# Patient Record
Sex: Male | Born: 1980 | Race: Black or African American | Hispanic: No | Marital: Single | Smoking: Former smoker
Health system: Southern US, Community
[De-identification: ages and names within clinical notes are randomized; demographics above are authoritative.]

## PROBLEM LIST (undated history)

## (undated) DIAGNOSIS — N321 Vesicointestinal fistula: Secondary | ICD-10-CM

## (undated) DIAGNOSIS — Z794 Long term (current) use of insulin: Secondary | ICD-10-CM

## (undated) DIAGNOSIS — N390 Urinary tract infection, site not specified: Secondary | ICD-10-CM

## (undated) DIAGNOSIS — N319 Neuromuscular dysfunction of bladder, unspecified: Secondary | ICD-10-CM

## (undated) DIAGNOSIS — M4628 Osteomyelitis of vertebra, sacral and sacrococcygeal region: Secondary | ICD-10-CM

## (undated) DIAGNOSIS — E649 Sequelae of unspecified nutritional deficiency: Secondary | ICD-10-CM

## (undated) DIAGNOSIS — I10 Essential (primary) hypertension: Secondary | ICD-10-CM

## (undated) DIAGNOSIS — E669 Obesity, unspecified: Secondary | ICD-10-CM

## (undated) DIAGNOSIS — J189 Pneumonia, unspecified organism: Secondary | ICD-10-CM

## (undated) DIAGNOSIS — E875 Hyperkalemia: Secondary | ICD-10-CM

## (undated) DIAGNOSIS — N322 Vesical fistula, not elsewhere classified: Secondary | ICD-10-CM

## (undated) DIAGNOSIS — Z7901 Long term (current) use of anticoagulants: Secondary | ICD-10-CM

## (undated) DIAGNOSIS — K219 Gastro-esophageal reflux disease without esophagitis: Secondary | ICD-10-CM

## (undated) DIAGNOSIS — Z906 Acquired absence of other parts of urinary tract: Secondary | ICD-10-CM

## (undated) DIAGNOSIS — A419 Sepsis, unspecified organism: Secondary | ICD-10-CM

## (undated) DIAGNOSIS — G8929 Other chronic pain: Secondary | ICD-10-CM

## (undated) DIAGNOSIS — Z932 Ileostomy status: Secondary | ICD-10-CM

## (undated) DIAGNOSIS — E108 Type 1 diabetes mellitus with unspecified complications: Secondary | ICD-10-CM

## (undated) DIAGNOSIS — L89154 Pressure ulcer of sacral region, stage 4: Secondary | ICD-10-CM

## (undated) DIAGNOSIS — Z86718 Personal history of other venous thrombosis and embolism: Secondary | ICD-10-CM

## (undated) DIAGNOSIS — G8221 Paraplegia, complete: Secondary | ICD-10-CM

## (undated) HISTORY — PX: REVISION UROSTOMY CUTANEOUS: SUR1282

## (undated) HISTORY — PX: COLOSTOMY: SHX63

---

## 2017-04-25 ENCOUNTER — Encounter (HOSPITAL_COMMUNITY): Payer: Self-pay | Admitting: Emergency Medicine

## 2017-04-25 ENCOUNTER — Inpatient Hospital Stay (HOSPITAL_COMMUNITY)
Admission: EM | Admit: 2017-04-25 | Discharge: 2017-05-03 | DRG: 871 | Disposition: A | Discharge status: Skilled Nursing Facility | Payer: Medicare Other | Attending: Internal Medicine | Admitting: Internal Medicine

## 2017-04-25 DIAGNOSIS — E876 Hypokalemia: Secondary | ICD-10-CM

## 2017-04-25 DIAGNOSIS — Z888 Allergy status to other drugs, medicaments and biological substances status: Secondary | ICD-10-CM

## 2017-04-25 DIAGNOSIS — G8220 Paraplegia, unspecified: Secondary | ICD-10-CM

## 2017-04-25 DIAGNOSIS — N179 Acute kidney failure, unspecified: Secondary | ICD-10-CM | POA: Diagnosis present

## 2017-04-25 DIAGNOSIS — N319 Neuromuscular dysfunction of bladder, unspecified: Secondary | ICD-10-CM | POA: Diagnosis present

## 2017-04-25 DIAGNOSIS — L89150 Pressure ulcer of sacral region, unstageable: Secondary | ICD-10-CM

## 2017-04-25 DIAGNOSIS — R443 Hallucinations, unspecified: Secondary | ICD-10-CM | POA: Diagnosis present

## 2017-04-25 DIAGNOSIS — Z79899 Other long term (current) drug therapy: Secondary | ICD-10-CM

## 2017-04-25 DIAGNOSIS — R9431 Abnormal electrocardiogram [ECG] [EKG]: Secondary | ICD-10-CM | POA: Diagnosis present

## 2017-04-25 DIAGNOSIS — W3400XS Accidental discharge from unspecified firearms or gun, sequela: Secondary | ICD-10-CM

## 2017-04-25 DIAGNOSIS — Z436 Encounter for attention to other artificial openings of urinary tract: Secondary | ICD-10-CM

## 2017-04-25 DIAGNOSIS — L89154 Pressure ulcer of sacral region, stage 4: Secondary | ICD-10-CM

## 2017-04-25 DIAGNOSIS — Z794 Long term (current) use of insulin: Secondary | ICD-10-CM

## 2017-04-25 DIAGNOSIS — B965 Pseudomonas (aeruginosa) (mallei) (pseudomallei) as the cause of diseases classified elsewhere: Secondary | ICD-10-CM | POA: Diagnosis present

## 2017-04-25 DIAGNOSIS — E1069 Type 1 diabetes mellitus with other specified complication: Secondary | ICD-10-CM | POA: Diagnosis present

## 2017-04-25 DIAGNOSIS — M4628 Osteomyelitis of vertebra, sacral and sacrococcygeal region: Secondary | ICD-10-CM | POA: Diagnosis present

## 2017-04-25 DIAGNOSIS — R7881 Bacteremia: Secondary | ICD-10-CM

## 2017-04-25 DIAGNOSIS — A419 Sepsis, unspecified organism: Secondary | ICD-10-CM | POA: Diagnosis not present

## 2017-04-25 DIAGNOSIS — R319 Hematuria, unspecified: Secondary | ICD-10-CM | POA: Diagnosis not present

## 2017-04-25 DIAGNOSIS — L98499 Non-pressure chronic ulcer of skin of other sites with unspecified severity: Secondary | ICD-10-CM

## 2017-04-25 DIAGNOSIS — Z936 Other artificial openings of urinary tract status: Secondary | ICD-10-CM

## 2017-04-25 DIAGNOSIS — L89893 Pressure ulcer of other site, stage 3: Secondary | ICD-10-CM | POA: Diagnosis present

## 2017-04-25 DIAGNOSIS — K219 Gastro-esophageal reflux disease without esophagitis: Secondary | ICD-10-CM | POA: Diagnosis present

## 2017-04-25 DIAGNOSIS — Z933 Colostomy status: Secondary | ICD-10-CM

## 2017-04-25 DIAGNOSIS — E10649 Type 1 diabetes mellitus with hypoglycemia without coma: Secondary | ICD-10-CM | POA: Diagnosis not present

## 2017-04-25 DIAGNOSIS — G8221 Paraplegia, complete: Secondary | ICD-10-CM | POA: Diagnosis present

## 2017-04-25 DIAGNOSIS — Z87891 Personal history of nicotine dependence: Secondary | ICD-10-CM

## 2017-04-25 DIAGNOSIS — N189 Chronic kidney disease, unspecified: Secondary | ICD-10-CM | POA: Diagnosis present

## 2017-04-25 DIAGNOSIS — E1022 Type 1 diabetes mellitus with diabetic chronic kidney disease: Secondary | ICD-10-CM | POA: Diagnosis present

## 2017-04-25 DIAGNOSIS — E1010 Type 1 diabetes mellitus with ketoacidosis without coma: Secondary | ICD-10-CM | POA: Diagnosis present

## 2017-04-25 DIAGNOSIS — Z932 Ileostomy status: Secondary | ICD-10-CM

## 2017-04-25 DIAGNOSIS — K920 Hematemesis: Secondary | ICD-10-CM | POA: Diagnosis present

## 2017-04-25 DIAGNOSIS — I129 Hypertensive chronic kidney disease with stage 1 through stage 4 chronic kidney disease, or unspecified chronic kidney disease: Secondary | ICD-10-CM | POA: Diagnosis present

## 2017-04-25 DIAGNOSIS — D649 Anemia, unspecified: Secondary | ICD-10-CM | POA: Diagnosis present

## 2017-04-25 DIAGNOSIS — Z7901 Long term (current) use of anticoagulants: Secondary | ICD-10-CM

## 2017-04-25 DIAGNOSIS — Z86718 Personal history of other venous thrombosis and embolism: Secondary | ICD-10-CM

## 2017-04-25 DIAGNOSIS — E1110 Type 2 diabetes mellitus with ketoacidosis without coma: Secondary | ICD-10-CM | POA: Diagnosis present

## 2017-04-25 HISTORY — DX: Personal history of other venous thrombosis and embolism: Z86.718

## 2017-04-25 HISTORY — DX: Essential (primary) hypertension: I10

## 2017-04-25 HISTORY — DX: Gastro-esophageal reflux disease without esophagitis: K21.9

## 2017-04-25 HISTORY — DX: Other chronic pain: G89.29

## 2017-04-25 HISTORY — DX: Long term (current) use of insulin: Z79.4

## 2017-04-25 HISTORY — DX: Sequelae of unspecified nutritional deficiency: E64.9

## 2017-04-25 HISTORY — DX: Long term (current) use of anticoagulants: Z79.01

## 2017-04-25 HISTORY — DX: Pressure ulcer of sacral region, stage 4: L89.154

## 2017-04-25 HISTORY — DX: Type 1 diabetes mellitus with unspecified complications: E10.8

## 2017-04-25 HISTORY — DX: Vesical fistula, not elsewhere classified: N32.2

## 2017-04-25 HISTORY — DX: Acquired absence of other parts of urinary tract: Z90.6

## 2017-04-25 HISTORY — DX: Vesicointestinal fistula: N32.1

## 2017-04-25 HISTORY — DX: Neuromuscular dysfunction of bladder, unspecified: N31.9

## 2017-04-25 HISTORY — DX: Obesity, unspecified: E66.9

## 2017-04-25 HISTORY — DX: Paraplegia, complete: G82.21

## 2017-04-25 HISTORY — DX: Ileostomy status: Z93.2

## 2017-04-25 HISTORY — DX: Urinary tract infection, site not specified: N39.0

## 2017-04-25 HISTORY — DX: Pneumonia, unspecified organism: J18.9

## 2017-04-25 HISTORY — DX: Sepsis, unspecified organism: A41.9

## 2017-04-25 HISTORY — DX: Osteomyelitis of vertebra, sacral and sacrococcygeal region: M46.28

## 2017-04-25 HISTORY — DX: Hyperkalemia: E87.5

## 2017-04-25 MED ORDER — ONDANSETRON HCL 4 MG/2ML IJ SOLN
4.0000 mg | Freq: Once | INTRAMUSCULAR | Status: AC
  Administered 2017-04-26: 4 mg via INTRAVENOUS
  Filled 2017-04-25: qty 2

## 2017-04-25 MED ORDER — SODIUM CHLORIDE 0.9 % IV BOLUS (SEPSIS)
1000.0000 mL | Freq: Once | INTRAVENOUS | Status: AC
  Administered 2017-04-26: 1000 mL via INTRAVENOUS
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
## 2017-04-26 ENCOUNTER — Emergency Department (HOSPITAL_COMMUNITY): Payer: Medicare Other

## 2017-04-26 ENCOUNTER — Other Ambulatory Visit: Payer: Self-pay

## 2017-04-26 ENCOUNTER — Encounter (HOSPITAL_COMMUNITY): Payer: Self-pay | Admitting: Internal Medicine

## 2017-04-26 DIAGNOSIS — E876 Hypokalemia: Secondary | ICD-10-CM

## 2017-04-26 DIAGNOSIS — R319 Hematuria, unspecified: Secondary | ICD-10-CM

## 2017-04-26 DIAGNOSIS — E1010 Type 1 diabetes mellitus with ketoacidosis without coma: Secondary | ICD-10-CM | POA: Diagnosis not present

## 2017-04-26 DIAGNOSIS — D649 Anemia, unspecified: Secondary | ICD-10-CM | POA: Diagnosis present

## 2017-04-26 DIAGNOSIS — Z888 Allergy status to other drugs, medicaments and biological substances status: Secondary | ICD-10-CM | POA: Diagnosis not present

## 2017-04-26 DIAGNOSIS — R9431 Abnormal electrocardiogram [ECG] [EKG]: Secondary | ICD-10-CM | POA: Diagnosis not present

## 2017-04-26 DIAGNOSIS — N189 Chronic kidney disease, unspecified: Secondary | ICD-10-CM | POA: Diagnosis present

## 2017-04-26 DIAGNOSIS — L89150 Pressure ulcer of sacral region, unstageable: Secondary | ICD-10-CM | POA: Diagnosis not present

## 2017-04-26 DIAGNOSIS — E1110 Type 2 diabetes mellitus with ketoacidosis without coma: Secondary | ICD-10-CM | POA: Diagnosis present

## 2017-04-26 DIAGNOSIS — Z436 Encounter for attention to other artificial openings of urinary tract: Secondary | ICD-10-CM | POA: Diagnosis not present

## 2017-04-26 DIAGNOSIS — L98499 Non-pressure chronic ulcer of skin of other sites with unspecified severity: Secondary | ICD-10-CM

## 2017-04-26 DIAGNOSIS — Z79899 Other long term (current) drug therapy: Secondary | ICD-10-CM | POA: Diagnosis not present

## 2017-04-26 DIAGNOSIS — L89893 Pressure ulcer of other site, stage 3: Secondary | ICD-10-CM | POA: Diagnosis not present

## 2017-04-26 DIAGNOSIS — G8221 Paraplegia, complete: Secondary | ICD-10-CM | POA: Diagnosis not present

## 2017-04-26 DIAGNOSIS — A419 Sepsis, unspecified organism: Secondary | ICD-10-CM | POA: Diagnosis not present

## 2017-04-26 DIAGNOSIS — L89154 Pressure ulcer of sacral region, stage 4: Secondary | ICD-10-CM | POA: Diagnosis not present

## 2017-04-26 DIAGNOSIS — Z794 Long term (current) use of insulin: Secondary | ICD-10-CM | POA: Diagnosis not present

## 2017-04-26 DIAGNOSIS — N179 Acute kidney failure, unspecified: Secondary | ICD-10-CM | POA: Diagnosis not present

## 2017-04-26 DIAGNOSIS — Z936 Other artificial openings of urinary tract status: Secondary | ICD-10-CM | POA: Diagnosis not present

## 2017-04-26 DIAGNOSIS — W3400XS Accidental discharge from unspecified firearms or gun, sequela: Secondary | ICD-10-CM | POA: Diagnosis not present

## 2017-04-26 DIAGNOSIS — E1022 Type 1 diabetes mellitus with diabetic chronic kidney disease: Secondary | ICD-10-CM | POA: Diagnosis present

## 2017-04-26 DIAGNOSIS — R443 Hallucinations, unspecified: Secondary | ICD-10-CM | POA: Diagnosis not present

## 2017-04-26 DIAGNOSIS — E10649 Type 1 diabetes mellitus with hypoglycemia without coma: Secondary | ICD-10-CM | POA: Diagnosis not present

## 2017-04-26 DIAGNOSIS — Z87891 Personal history of nicotine dependence: Secondary | ICD-10-CM | POA: Diagnosis not present

## 2017-04-26 DIAGNOSIS — Z7901 Long term (current) use of anticoagulants: Secondary | ICD-10-CM | POA: Diagnosis not present

## 2017-04-26 DIAGNOSIS — Z86718 Personal history of other venous thrombosis and embolism: Secondary | ICD-10-CM | POA: Diagnosis not present

## 2017-04-26 DIAGNOSIS — M4628 Osteomyelitis of vertebra, sacral and sacrococcygeal region: Secondary | ICD-10-CM | POA: Diagnosis not present

## 2017-04-26 DIAGNOSIS — Z933 Colostomy status: Secondary | ICD-10-CM | POA: Diagnosis not present

## 2017-04-26 DIAGNOSIS — K920 Hematemesis: Secondary | ICD-10-CM | POA: Diagnosis not present

## 2017-04-26 LAB — CBC WITH DIFFERENTIAL/PLATELET
BASOS ABS: 0 10*3/uL (ref 0.0–0.1)
Basophils Absolute: 0 10*3/uL (ref 0.0–0.1)
Basophils Relative: 0 %
Basophils Relative: 0 %
EOS PCT: 0 %
Eosinophils Absolute: 0.1 10*3/uL (ref 0.0–0.7)
Eosinophils Absolute: 0.1 10*3/uL (ref 0.0–0.7)
Eosinophils Relative: 0 %
HCT: 26.9 % — ABNORMAL LOW (ref 39.0–52.0)
HEMATOCRIT: 25.9 % — AB (ref 39.0–52.0)
HEMOGLOBIN: 8.4 g/dL — AB (ref 13.0–17.0)
Hemoglobin: 8 g/dL — ABNORMAL LOW (ref 13.0–17.0)
LYMPHS ABS: 1.7 10*3/uL (ref 0.7–4.0)
LYMPHS PCT: 9 %
Lymphocytes Relative: 2 %
Lymphs Abs: 0.3 10*3/uL — ABNORMAL LOW (ref 0.7–4.0)
MCH: 26.5 pg (ref 26.0–34.0)
MCH: 26.6 pg (ref 26.0–34.0)
MCHC: 31.2 g/dL (ref 30.0–36.0)
MCHC: 30.9 g/dL (ref 30.0–36.0)
MCV: 84.9 fL (ref 78.0–100.0)
MCV: 86 fL (ref 78.0–100.0)
MONO ABS: 0.9 10*3/uL (ref 0.1–1.0)
MONOS PCT: 5 %
Monocytes Absolute: 1.9 10*3/uL — ABNORMAL HIGH (ref 0.1–1.0)
Monocytes Relative: 10 %
NEUTROS ABS: 14.8 10*3/uL — AB (ref 1.7–7.7)
NEUTROS PCT: 81 %
NEUTROS PCT: 93 %
Neutro Abs: 18.8 10*3/uL — ABNORMAL HIGH (ref 1.7–7.7)
PLATELETS: 374 10*3/uL (ref 150–400)
Platelets: 398 10*3/uL (ref 150–400)
RBC: 3.17 MIL/uL — AB (ref 4.22–5.81)
RBC: 3.01 MIL/uL — ABNORMAL LOW (ref 4.22–5.81)
RDW: 15.9 % — ABNORMAL HIGH (ref 11.5–15.5)
RDW: 16.1 % — AB (ref 11.5–15.5)
WBC: 18.4 10*3/uL — AB (ref 4.0–10.5)
WBC: 20.1 10*3/uL — ABNORMAL HIGH (ref 4.0–10.5)

## 2017-04-26 LAB — BLOOD CULTURE ID PANEL (REFLEXED)
Acinetobacter baumannii: NOT DETECTED
CARBAPENEM RESISTANCE: NOT DETECTED
Candida albicans: NOT DETECTED
Candida glabrata: NOT DETECTED
Candida krusei: NOT DETECTED
Candida parapsilosis: NOT DETECTED
Candida tropicalis: NOT DETECTED
ENTEROBACTERIACEAE SPECIES: NOT DETECTED
Enterobacter cloacae complex: NOT DETECTED
Enterococcus species: NOT DETECTED
Escherichia coli: NOT DETECTED
HAEMOPHILUS INFLUENZAE: NOT DETECTED
Klebsiella oxytoca: NOT DETECTED
Klebsiella pneumoniae: NOT DETECTED
Listeria monocytogenes: NOT DETECTED
Neisseria meningitidis: NOT DETECTED
PSEUDOMONAS AERUGINOSA: DETECTED — AB
Proteus species: NOT DETECTED
STAPHYLOCOCCUS AUREUS BCID: NOT DETECTED
STREPTOCOCCUS PNEUMONIAE: NOT DETECTED
STREPTOCOCCUS PYOGENES: NOT DETECTED
STREPTOCOCCUS SPECIES: NOT DETECTED
Serratia marcescens: NOT DETECTED
Staphylococcus species: NOT DETECTED
Streptococcus agalactiae: NOT DETECTED

## 2017-04-26 LAB — GLUCOSE, CAPILLARY
GLUCOSE-CAPILLARY: 298 mg/dL — AB (ref 65–99)
GLUCOSE-CAPILLARY: 250 mg/dL — AB (ref 65–99)
GLUCOSE-CAPILLARY: 248 mg/dL — AB (ref 65–99)
GLUCOSE-CAPILLARY: 215 mg/dL — AB (ref 65–99)
GLUCOSE-CAPILLARY: 141 mg/dL — AB (ref 65–99)
GLUCOSE-CAPILLARY: 110 mg/dL — AB (ref 65–99)
Glucose-Capillary: 288 mg/dL — ABNORMAL HIGH (ref 65–99)
Glucose-Capillary: 194 mg/dL — ABNORMAL HIGH (ref 65–99)
Glucose-Capillary: 160 mg/dL — ABNORMAL HIGH (ref 65–99)

## 2017-04-26 LAB — IRON AND TIBC: Iron: 13 ug/dL — ABNORMAL LOW (ref 45–182)

## 2017-04-26 LAB — URINALYSIS, ROUTINE W REFLEX MICROSCOPIC
Glucose, UA: NEGATIVE mg/dL
Ketones, ur: NEGATIVE mg/dL
NITRITE: NEGATIVE
PROTEIN: 100 mg/dL — AB
Specific Gravity, Urine: 1.02 (ref 1.005–1.030)
pH: 6 (ref 5.0–8.0)

## 2017-04-26 LAB — BASIC METABOLIC PANEL
ANION GAP: 13 (ref 5–15)
ANION GAP: 8 (ref 5–15)
Anion gap: 15 (ref 5–15)
Anion gap: 15 (ref 5–15)
BUN: 39 mg/dL — AB (ref 6–20)
BUN: 32 mg/dL — ABNORMAL HIGH (ref 6–20)
BUN: 31 mg/dL — AB (ref 6–20)
BUN: 32 mg/dL — ABNORMAL HIGH (ref 6–20)
CALCIUM: 7.4 mg/dL — AB (ref 8.9–10.3)
CALCIUM: 7.5 mg/dL — AB (ref 8.9–10.3)
CHLORIDE: 110 mmol/L (ref 101–111)
CHLORIDE: 110 mmol/L (ref 101–111)
CHLORIDE: 111 mmol/L (ref 101–111)
CO2: 13 mmol/L — ABNORMAL LOW (ref 22–32)
CO2: 11 mmol/L — ABNORMAL LOW (ref 22–32)
CO2: 11 mmol/L — ABNORMAL LOW (ref 22–32)
CO2: 15 mmol/L — AB (ref 22–32)
Calcium: 8.1 mg/dL — ABNORMAL LOW (ref 8.9–10.3)
Calcium: 7.4 mg/dL — ABNORMAL LOW (ref 8.9–10.3)
Chloride: 112 mmol/L — ABNORMAL HIGH (ref 101–111)
Creatinine, Ser: 1.22 mg/dL (ref 0.61–1.24)
Creatinine, Ser: 1.37 mg/dL — ABNORMAL HIGH (ref 0.61–1.24)
Creatinine, Ser: 1.43 mg/dL — ABNORMAL HIGH (ref 0.61–1.24)
Creatinine, Ser: 1.38 mg/dL — ABNORMAL HIGH (ref 0.61–1.24)
GFR calc Af Amer: >60 mL/min (ref >60)
GFR calc Af Amer: >60 mL/min (ref >60)
GFR calc Af Amer: >60 mL/min (ref >60)
GFR calc non Af Amer: >60 mL/min (ref >60)
GFR calc non Af Amer: >60 mL/min (ref >60)
GLUCOSE: 285 mg/dL — AB (ref 65–99)
Glucose, Bld: 329 mg/dL — ABNORMAL HIGH (ref 65–99)
Glucose, Bld: 161 mg/dL — ABNORMAL HIGH (ref 65–99)
Glucose, Bld: 125 mg/dL — ABNORMAL HIGH (ref 65–99)
POTASSIUM: 3.4 mmol/L — AB (ref 3.5–5.1)
POTASSIUM: 3.6 mmol/L (ref 3.5–5.1)
POTASSIUM: 3.9 mmol/L (ref 3.5–5.1)
Potassium: 3.4 mmol/L — ABNORMAL LOW (ref 3.5–5.1)
SODIUM: 138 mmol/L (ref 135–145)
SODIUM: 136 mmol/L (ref 135–145)
SODIUM: 135 mmol/L (ref 135–145)
Sodium: 135 mmol/L (ref 135–145)

## 2017-04-26 LAB — BLOOD GAS, VENOUS
ACID-BASE DEFICIT: 13.4 mmol/L — AB (ref 0.0–2.0)
BICARBONATE: 11.9 mmol/L — AB (ref 20.0–28.0)
FIO2: 21
O2 Saturation: 67 %
PCO2 VEN: 26.3 mmHg — AB (ref 44.0–60.0)
PH VEN: 7.276 (ref 7.250–7.430)
Patient temperature: 98.6
pO2, Ven: 42.2 mmHg (ref 32.0–45.0)

## 2017-04-26 LAB — URINALYSIS, MICROSCOPIC (REFLEX): SQUAMOUS EPITHELIAL / LPF: NONE SEEN

## 2017-04-26 LAB — I-STAT CG4 LACTIC ACID, ED: LACTIC ACID, VENOUS: 0.93 mmol/L (ref 0.5–1.9)

## 2017-04-26 LAB — MRSA PCR SCREENING: MRSA by PCR: POSITIVE — AB

## 2017-04-26 LAB — FERRITIN: FERRITIN: 348 ng/mL — AB (ref 24–336)

## 2017-04-26 LAB — VITAMIN B12: Vitamin B-12: 893 pg/mL (ref 180–914)

## 2017-04-26 MED ORDER — VANCOMYCIN HCL IN DEXTROSE 1-5 GM/200ML-% IV SOLN
1000.0000 mg | Freq: Once | INTRAVENOUS | Status: AC
  Administered 2017-04-26: 1000 mg via INTRAVENOUS
  Filled 2017-04-26: qty 200

## 2017-04-26 MED ORDER — SODIUM CHLORIDE 0.9 % IV SOLN
INTRAVENOUS | Status: AC
  Administered 2017-04-26: 07:00:00 via INTRAVENOUS

## 2017-04-26 MED ORDER — NYSTATIN 100000 UNIT/ML MT SUSP
5.0000 mL | Freq: Four times a day (QID) | OROMUCOSAL | Status: DC
  Administered 2017-04-26 – 2017-05-03 (×29): 500000 [IU] via ORAL
  Filled 2017-04-26 (×29): qty 5

## 2017-04-26 MED ORDER — PIPERACILLIN-TAZOBACTAM 3.375 G IVPB 30 MIN
3.3750 g | Freq: Once | INTRAVENOUS | Status: AC
  Administered 2017-04-26: 3.375 g via INTRAVENOUS
  Filled 2017-04-26: qty 50

## 2017-04-26 MED ORDER — MORPHINE SULFATE (PF) 4 MG/ML IV SOLN
1.0000 mg | INTRAVENOUS | Status: DC | PRN
Start: 2017-04-26 — End: 2017-05-03
  Administered 2017-04-30 – 2017-05-03 (×12): 2 mg via INTRAVENOUS
  Filled 2017-04-26 (×12): qty 1

## 2017-04-26 MED ORDER — DICYCLOMINE HCL 20 MG PO TABS
20.0000 mg | ORAL_TABLET | Freq: Two times a day (BID) | ORAL | Status: DC
  Administered 2017-04-26 – 2017-05-03 (×13): 20 mg via ORAL
  Filled 2017-04-26 (×16): qty 1

## 2017-04-26 MED ORDER — LIP MEDEX EX OINT
TOPICAL_OINTMENT | CUTANEOUS | Status: AC
  Administered 2017-04-26: 12:00:00
  Filled 2017-04-26: qty 7

## 2017-04-26 MED ORDER — SODIUM CHLORIDE 0.9% FLUSH
10.0000 mL | INTRAVENOUS | Status: DC | PRN
Start: 2017-04-26 — End: 2017-05-03
  Administered 2017-04-30 – 2017-05-03 (×5): 10 mL
  Filled 2017-04-26 (×5): qty 40

## 2017-04-26 MED ORDER — INSULIN ASPART 100 UNIT/ML ~~LOC~~ SOLN
0.0000 [IU] | Freq: Three times a day (TID) | SUBCUTANEOUS | Status: DC
  Administered 2017-04-26: 1 [IU] via SUBCUTANEOUS
  Administered 2017-04-26 – 2017-04-27 (×2): 2 [IU] via SUBCUTANEOUS
  Administered 2017-04-28 – 2017-04-29 (×3): 1 [IU] via SUBCUTANEOUS
  Administered 2017-04-29: 2 [IU] via SUBCUTANEOUS
  Administered 2017-04-29: 3 [IU] via SUBCUTANEOUS
  Administered 2017-04-30: 5 [IU] via SUBCUTANEOUS
  Administered 2017-04-30: 3 [IU] via SUBCUTANEOUS

## 2017-04-26 MED ORDER — OXYCODONE HCL 5 MG PO TABS
20.0000 mg | ORAL_TABLET | ORAL | Status: DC
  Administered 2017-04-26 – 2017-04-29 (×19): 20 mg via ORAL
  Filled 2017-04-26 (×20): qty 4

## 2017-04-26 MED ORDER — SODIUM BICARBONATE 650 MG PO TABS
650.0000 mg | ORAL_TABLET | Freq: Three times a day (TID) | ORAL | Status: DC
  Administered 2017-04-26 – 2017-04-29 (×10): 650 mg via ORAL
  Filled 2017-04-26 (×10): qty 1

## 2017-04-26 MED ORDER — OXYCODONE HCL 5 MG PO TABS
20.0000 mg | ORAL_TABLET | ORAL | Status: AC
  Administered 2017-04-26: 20 mg via ORAL
  Filled 2017-04-26: qty 4

## 2017-04-26 MED ORDER — VANCOMYCIN HCL IN DEXTROSE 1-5 GM/200ML-% IV SOLN: 1000.0000 mg | Freq: Three times a day (TID) | INTRAVENOUS | Status: DC

## 2017-04-26 MED ORDER — MAGNESIUM SULFATE 2 GM/50ML IV SOLN
2.0000 g | Freq: Once | INTRAVENOUS | Status: AC
  Administered 2017-04-26: 2 g via INTRAVENOUS
  Filled 2017-04-26: qty 50

## 2017-04-26 MED ORDER — DEXTROSE-NACL 5-0.45 % IV SOLN
INTRAVENOUS | Status: DC
  Administered 2017-04-26: 11:00:00 via INTRAVENOUS

## 2017-04-26 MED ORDER — OLOPATADINE HCL 0.1 % OP SOLN
1.0000 [drp] | Freq: Two times a day (BID) | OPHTHALMIC | Status: DC
  Administered 2017-04-26 – 2017-05-03 (×14): 1 [drp] via OPHTHALMIC
  Filled 2017-04-26 (×2): qty 5

## 2017-04-26 MED ORDER — FLUCONAZOLE 150 MG PO TABS
150.0000 mg | ORAL_TABLET | Freq: Once | ORAL | Status: AC
  Administered 2017-04-26: 150 mg via ORAL
  Filled 2017-04-26: qty 1

## 2017-04-26 MED ORDER — VITAMIN C 500 MG PO TABS
500.0000 mg | ORAL_TABLET | Freq: Two times a day (BID) | ORAL | Status: DC
  Administered 2017-04-26 – 2017-05-03 (×14): 500 mg via ORAL
  Filled 2017-04-26 (×14): qty 1

## 2017-04-26 MED ORDER — SODIUM CHLORIDE 0.9 % IV SOLN
INTRAVENOUS | Status: DC
  Administered 2017-04-26: 08:00:00 via INTRAVENOUS
  Filled 2017-04-26: qty 1

## 2017-04-26 MED ORDER — ONDANSETRON HCL 4 MG/2ML IJ SOLN
4.0000 mg | Freq: Four times a day (QID) | INTRAMUSCULAR | Status: DC | PRN
  Administered 2017-04-26: 4 mg via INTRAVENOUS
  Filled 2017-04-26: qty 2

## 2017-04-26 MED ORDER — SODIUM CHLORIDE 0.9 % IV SOLN
INTRAVENOUS | Status: AC
  Administered 2017-04-26: via INTRAVENOUS

## 2017-04-26 MED ORDER — INSULIN DETEMIR 100 UNIT/ML ~~LOC~~ SOLN
40.0000 [IU] | Freq: Two times a day (BID) | SUBCUTANEOUS | Status: DC
  Administered 2017-04-26 (×2): 40 [IU] via SUBCUTANEOUS
  Filled 2017-04-26 (×4): qty 0.4

## 2017-04-26 MED ORDER — POTASSIUM CHLORIDE 10 MEQ/100ML IV SOLN
10.0000 meq | INTRAVENOUS | Status: AC
  Administered 2017-04-26 (×4): 10 meq via INTRAVENOUS
  Filled 2017-04-26 (×3): qty 100

## 2017-04-26 MED ORDER — VANCOMYCIN HCL IN DEXTROSE 1-5 GM/200ML-% IV SOLN
1000.0000 mg | Freq: Two times a day (BID) | INTRAVENOUS | Status: DC
  Administered 2017-04-26 – 2017-04-29 (×7): 1000 mg via INTRAVENOUS
  Filled 2017-04-26 (×7): qty 200

## 2017-04-26 MED ORDER — APIXABAN 2.5 MG PO TABS
2.5000 mg | ORAL_TABLET | Freq: Two times a day (BID) | ORAL | Status: DC
  Administered 2017-04-26 – 2017-05-03 (×14): 2.5 mg via ORAL
  Filled 2017-04-26 (×15): qty 1

## 2017-04-26 MED ORDER — SODIUM CHLORIDE 0.9 % IV SOLN
INTRAVENOUS | Status: DC
  Administered 2017-04-26: 07:00:00 via INTRAVENOUS

## 2017-04-26 MED ORDER — DOCUSATE SODIUM 100 MG PO CAPS
100.0000 mg | ORAL_CAPSULE | Freq: Every day | ORAL | Status: DC
Start: 2017-04-26 — End: 2017-05-03
  Administered 2017-04-27 – 2017-05-03 (×5): 100 mg via ORAL
  Filled 2017-04-26 (×7): qty 1

## 2017-04-26 MED ORDER — INSULIN ASPART 100 UNIT/ML ~~LOC~~ SOLN
0.0000 [IU] | Freq: Every day | SUBCUTANEOUS | Status: DC
  Administered 2017-04-30: 2 [IU] via SUBCUTANEOUS

## 2017-04-26 MED ORDER — CYCLOBENZAPRINE HCL 10 MG PO TABS
10.0000 mg | ORAL_TABLET | Freq: Two times a day (BID) | ORAL | Status: DC | PRN
  Administered 2017-04-26 – 2017-05-02 (×5): 10 mg via ORAL
  Filled 2017-04-26 (×6): qty 1

## 2017-04-26 MED ORDER — PIOGLITAZONE HCL 30 MG PO TABS
30.0000 mg | ORAL_TABLET | Freq: Every day | ORAL | Status: DC
  Filled 2017-04-26: qty 1

## 2017-04-26 MED ORDER — DILTIAZEM HCL 25 MG/5ML IV SOLN
15.0000 mg | Freq: Once | INTRAVENOUS | Status: AC
  Administered 2017-04-26: 15 mg via INTRAVENOUS
  Filled 2017-04-26: qty 5

## 2017-04-26 MED ORDER — PIPERACILLIN-TAZOBACTAM 3.375 G IVPB
3.3750 g | Freq: Three times a day (TID) | INTRAVENOUS | Status: DC
  Administered 2017-04-26 – 2017-04-27 (×3): 3.375 g via INTRAVENOUS
  Filled 2017-04-26 (×3): qty 50

## 2017-04-26 MED ORDER — DIPHENHYDRAMINE HCL 50 MG/ML IJ SOLN
12.5000 mg | Freq: Four times a day (QID) | INTRAMUSCULAR | Status: DC | PRN
  Administered 2017-04-26: 12.5 mg via INTRAVENOUS
  Filled 2017-04-26 (×2): qty 1

## 2017-04-26 MED ORDER — MUPIROCIN 2 % EX OINT
1.0000 "application " | TOPICAL_OINTMENT | Freq: Two times a day (BID) | CUTANEOUS | Status: AC
  Administered 2017-04-26 – 2017-04-30 (×10): 1 via NASAL
  Filled 2017-04-26 (×3): qty 22

## 2017-04-26 MED ORDER — ONDANSETRON HCL 4 MG PO TABS
4.0000 mg | ORAL_TABLET | Freq: Four times a day (QID) | ORAL | Status: DC | PRN
Start: 2017-04-26 — End: 2017-04-26

## 2017-04-26 MED ORDER — PANTOPRAZOLE SODIUM 40 MG PO TBEC
40.0000 mg | DELAYED_RELEASE_TABLET | Freq: Every day | ORAL | Status: DC
  Administered 2017-04-26: 40 mg via ORAL
  Filled 2017-04-26: qty 1

## 2017-04-26 MED ORDER — CHLORHEXIDINE GLUCONATE CLOTH 2 % EX PADS
6.0000 | MEDICATED_PAD | Freq: Every day | CUTANEOUS | Status: DC
  Administered 2017-04-27: 6 via TOPICAL

## 2017-04-26 MED ORDER — FOLIC ACID 1 MG PO TABS
1.0000 mg | ORAL_TABLET | Freq: Every day | ORAL | Status: DC
  Administered 2017-04-27 – 2017-05-03 (×6): 1 mg via ORAL
  Filled 2017-04-26 (×7): qty 1

## 2017-04-26 MED ORDER — POTASSIUM CHLORIDE CRYS ER 20 MEQ PO TBCR
40.0000 meq | EXTENDED_RELEASE_TABLET | Freq: Two times a day (BID) | ORAL | Status: AC
  Administered 2017-04-26 (×2): 40 meq via ORAL
  Filled 2017-04-26 (×2): qty 2

## 2017-04-26 MED ORDER — PRO-STAT SUGAR FREE PO LIQD
30.0000 mL | Freq: Three times a day (TID) | ORAL | Status: DC
  Administered 2017-04-26 – 2017-05-03 (×14): 30 mL via ORAL
  Filled 2017-04-26 (×14): qty 30

## 2017-04-26 MED ORDER — OXYCODONE HCL 5 MG PO TABS
20.0000 mg | ORAL_TABLET | Freq: Once | ORAL | Status: AC
  Administered 2017-04-26: 20 mg via ORAL
  Filled 2017-04-26: qty 4

## 2017-04-26 MED ORDER — FERROUS SULFATE 325 (65 FE) MG PO TABS
325.0000 mg | ORAL_TABLET | Freq: Two times a day (BID) | ORAL | Status: DC
  Administered 2017-04-26 – 2017-05-03 (×13): 325 mg via ORAL
  Filled 2017-04-26 (×14): qty 1

## 2017-04-26 NOTE — Progress Notes (Signed)
PHARMACY - PHYSICIAN COMMUNICATION CRITICAL VALUE ALERT - BLOOD CULTURE IDENTIFICATION (BCID)  Results for orders placed or performed during the hospital encounter of 04/25/17  Blood Culture ID Panel (Reflexed) (Collected: 04/26/2017  1:11 AM)  Result Value Ref Range   Enterococcus species NOT DETECTED NOT DETECTED   Listeria monocytogenes NOT DETECTED NOT DETECTED   Staphylococcus species NOT DETECTED NOT DETECTED   Staphylococcus aureus NOT DETECTED NOT DETECTED   Streptococcus species NOT DETECTED NOT DETECTED   Streptococcus agalactiae NOT DETECTED NOT DETECTED   Streptococcus pneumoniae NOT DETECTED NOT DETECTED   Streptococcus pyogenes NOT DETECTED NOT DETECTED   Acinetobacter baumannii NOT DETECTED NOT DETECTED   Enterobacteriaceae species NOT DETECTED NOT DETECTED   Enterobacter cloacae complex NOT DETECTED NOT DETECTED   Escherichia coli NOT DETECTED NOT DETECTED   Klebsiella oxytoca NOT DETECTED NOT DETECTED   Klebsiella pneumoniae NOT DETECTED NOT DETECTED   Proteus species NOT DETECTED NOT DETECTED   Serratia marcescens NOT DETECTED NOT DETECTED   Carbapenem resistance NOT DETECTED NOT DETECTED   Haemophilus influenzae NOT DETECTED NOT DETECTED   Neisseria meningitidis NOT DETECTED NOT DETECTED   Pseudomonas aeruginosa DETECTED (A) NOT DETECTED   Candida albicans NOT DETECTED NOT DETECTED   Candida glabrata NOT DETECTED NOT DETECTED   Candida krusei NOT DETECTED NOT DETECTED   Candida parapsilosis NOT DETECTED NOT DETECTED   Candida tropicalis NOT DETECTED NOT DETECTED    Name of physician (or Provider) Contacted: Dr. Blake Divine alerted via text page  Changes to prescribed antibiotics required: recommendations provided, no changes, continuing Zosyn  Clance Boll 04/26/2017  7:15 PM

## 2017-04-26 NOTE — Progress Notes (Signed)
A consult was received from an ED physician for Vancomycin and Zosyn  per pharmacy dosing.  The patient's profile has been reviewed for ht/wt/allergies/indication/available labs.   A one time order has been placed for Vancomycin 1gm iv x1 and Zosyn 3.375gm iv x1.  Further antibiotics/pharmacy consults should be ordered by admitting physician if indicated.                       Thank you, Aleene Davidson Crowford 04/26/2017  3:03 AM

## 2017-04-26 NOTE — ED Notes (Signed)
Pt stated that he has been diagnosed with thrush. He does have white patches located on his tongue. PA notified.

## 2017-04-26 NOTE — ED Notes (Signed)
Attempted report x1. 

## 2017-04-26 NOTE — Progress Notes (Signed)
Pharmacy Brief Note: Vancomycin dose adjustment  Due to paraplegia (SCr may overestimate actual CrCl), adjust vancomycin to 1gm IV q12h and follow renal function and troughs as indicated.  Juliette Alcide, PharmD, BCPS.   Pager: [phone] 04/26/2017 9:38 AM

## 2017-04-26 NOTE — Progress Notes (Signed)
Patient's HR remain 130s-140s so far this shift. On-call MD notified and EKG obtained as ordered. QTc measuring 606 and rhythm Accelerated Junctional Rhythm. On-call MD again notified.
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
## 2017-04-26 NOTE — Progress Notes (Signed)
Pharmacy Antibiotic Note  Miriam Kestler is a 36 y.o. male admitted on 04/25/2017 with Wound infection.  Pharmacy has been consulted for Vancomycin and Zosyn dosing.  Plan: Vancomycin 1gm IV every 8 hours.  Goal trough 15-20 mcg/mL. Zosyn 3.375g IV q8h (4 hour infusion).  Height: 6' (182.9 cm) Weight: 235 lb (106.6 kg) IBW/kg (Calculated) : 77.6  Temp (24hrs), Avg:99.1 F (37.3 C), Min:98.6 F (37 C), Max:99.8 F (37.7 C)   Recent Labs Lab 04/26/17 0111 04/26/17 0118  WBC 18.4*  --   CREATININE 1.22  --   LATICACIDVEN  --  0.93    Estimated Creatinine Clearance: 107.6 mL/min (by C-G formula based on SCr of 1.22 mg/dL).    Allergies  Allergen Reactions  . Lisinopril     Per MAR    Antimicrobials this admission: Vancomycin 04/26/2017 >> Zosyn 04/26/2017 >>   Dose adjustments this admission: -  Microbiology results: pending  Thank you for allowing pharmacy to be a part of this patient's care.  Aleene Davidson Crowford 04/26/2017 6:35 AM

## 2017-04-27 DIAGNOSIS — R9431 Abnormal electrocardiogram [ECG] [EKG]: Secondary | ICD-10-CM

## 2017-04-27 DIAGNOSIS — L89150 Pressure ulcer of sacral region, unstageable: Secondary | ICD-10-CM

## 2017-04-27 LAB — CBC
HEMATOCRIT: 22.2 % — AB (ref 39.0–52.0)
Hemoglobin: 7.1 g/dL — ABNORMAL LOW (ref 13.0–17.0)
MCH: 27.2 pg (ref 26.0–34.0)
MCHC: 32 g/dL (ref 30.0–36.0)
MCV: 85.1 fL (ref 78.0–100.0)
Platelets: 373 10*3/uL (ref 150–400)
RBC: 2.61 MIL/uL — ABNORMAL LOW (ref 4.22–5.81)
RDW: 16.1 % — AB (ref 11.5–15.5)
WBC: 19.7 10*3/uL — ABNORMAL HIGH (ref 4.0–10.5)

## 2017-04-27 LAB — BASIC METABOLIC PANEL
ANION GAP: 6 (ref 5–15)
BUN: 34 mg/dL — ABNORMAL HIGH (ref 6–20)
CALCIUM: 7.8 mg/dL — AB (ref 8.9–10.3)
CO2: 15 mmol/L — AB (ref 22–32)
Chloride: 115 mmol/L — ABNORMAL HIGH (ref 101–111)
Creatinine, Ser: 1.38 mg/dL — ABNORMAL HIGH (ref 0.61–1.24)
GFR calc Af Amer: >60 mL/min (ref >60)
GFR calc non Af Amer: >60 mL/min (ref >60)
GLUCOSE: 192 mg/dL — AB (ref 65–99)
Potassium: 4 mmol/L (ref 3.5–5.1)
Sodium: 136 mmol/L (ref 135–145)

## 2017-04-27 LAB — GLUCOSE, CAPILLARY
GLUCOSE-CAPILLARY: 48 mg/dL — AB (ref 65–99)
GLUCOSE-CAPILLARY: 169 mg/dL — AB (ref 65–99)
GLUCOSE-CAPILLARY: 104 mg/dL — AB (ref 65–99)
Glucose-Capillary: 107 mg/dL — ABNORMAL HIGH (ref 65–99)
Glucose-Capillary: 96 mg/dL (ref 65–99)
Glucose-Capillary: 34 mg/dL — CL (ref 65–99)
Glucose-Capillary: 87 mg/dL (ref 65–99)
Glucose-Capillary: 144 mg/dL — ABNORMAL HIGH (ref 65–99)

## 2017-04-27 LAB — URINE CULTURE

## 2017-04-27 LAB — TSH: TSH: 3.164 u[IU]/mL (ref 0.350–4.500)

## 2017-04-27 LAB — HIV ANTIBODY (ROUTINE TESTING W REFLEX): HIV SCREEN 4TH GENERATION: NONREACTIVE

## 2017-04-27 MED ORDER — SODIUM CHLORIDE 0.9 % IV BOLUS (SEPSIS)
1000.0000 mL | Freq: Once | INTRAVENOUS | Status: AC
  Administered 2017-04-27: 1000 mL via INTRAVENOUS

## 2017-04-27 MED ORDER — DEXTROSE 5 % IV SOLN
2.0000 g | Freq: Three times a day (TID) | INTRAVENOUS | Status: DC
  Administered 2017-04-27 – 2017-04-28 (×4): 2 g via INTRAVENOUS
  Filled 2017-04-27 (×5): qty 2

## 2017-04-27 MED ORDER — HYDROXYZINE HCL 25 MG PO TABS
25.0000 mg | ORAL_TABLET | Freq: Three times a day (TID) | ORAL | Status: DC | PRN
  Administered 2017-04-27 – 2017-05-01 (×4): 25 mg via ORAL
  Filled 2017-04-27 (×4): qty 1

## 2017-04-27 MED ORDER — SODIUM CHLORIDE 0.9 % IV SOLN
INTRAVENOUS | Status: AC
  Administered 2017-04-27: 10:00:00 via INTRAVENOUS

## 2017-04-27 MED ORDER — INSULIN DETEMIR 100 UNIT/ML ~~LOC~~ SOLN
20.0000 [IU] | Freq: Every day | SUBCUTANEOUS | Status: DC
  Administered 2017-04-27 – 2017-04-28 (×2): 20 [IU] via SUBCUTANEOUS
  Filled 2017-04-27 (×3): qty 0.2
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
## 2017-04-27 NOTE — Progress Notes (Signed)
Pharmacy Antibiotic Note  [name] is a 36 y.o. male admitted on 04/25/2017 with Wound infection.  Pharmacy has been consulted for Vancomycin and Zosyn dosing on 10/13. To now change Zosyn to cefepime due to pseudomonas bacteremia.   Plan: 1) Continue current vancomycin dosing 2) Start cefepime 2g IV q8 per current renal function  Height: 6' (182.9 cm) Weight: 214 lb 8.1 oz (97.3 kg) IBW/kg (Calculated) : 77.6  Temp (24hrs), Avg:99.6 F (37.6 C), Min:98.4 F (36.9 C), Max:100.7 F (38.2 C)   Recent Labs Lab 04/26/17 0111 04/26/17 0118 04/26/17 0656 04/26/17 1021 04/26/17 2102 04/27/17 0108  WBC 18.4*  --  20.1*  --   --   --   CREATININE 1.22  --  1.37* 1.43* 1.38* 1.38*  LATICACIDVEN  --  0.93  --   --   --   --     Estimated Creatinine Clearance: 91.2 mL/min (A) (by C-G formula based on SCr of 1.38 mg/dL (H)).    Allergies  Allergen Reactions  . Lisinopril     Per MAR    Antimicrobials this admission: Vancomycin 10/13 >> Zosyn 10/13 >> 10/14 Cefepime 10/14 >>   Dose adjustments this admission: -  Microbiology results: 10/13 blood: pseudomonas 10/13 MRSA PCR: positive   Thank you for allowing pharmacy to be a part of this patient's care.  Hessie Knows, PharmD, BCPS Pager [phone] 04/27/2017 9:44 AM
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
## 2017-04-28 ENCOUNTER — Inpatient Hospital Stay (HOSPITAL_COMMUNITY): Payer: Medicare Other

## 2017-04-28 DIAGNOSIS — R9431 Abnormal electrocardiogram [ECG] [EKG]: Secondary | ICD-10-CM

## 2017-04-28 LAB — CULTURE, BLOOD (ROUTINE X 2): SPECIAL REQUESTS: ADEQUATE

## 2017-04-28 LAB — BASIC METABOLIC PANEL
ANION GAP: 6 (ref 5–15)
BUN: 34 mg/dL — ABNORMAL HIGH (ref 6–20)
CO2: 14 mmol/L — ABNORMAL LOW (ref 22–32)
Calcium: 8 mg/dL — ABNORMAL LOW (ref 8.9–10.3)
Chloride: 116 mmol/L — ABNORMAL HIGH (ref 101–111)
Creatinine, Ser: 1.37 mg/dL — ABNORMAL HIGH (ref 0.61–1.24)
Glucose, Bld: 110 mg/dL — ABNORMAL HIGH (ref 65–99)
POTASSIUM: 3.3 mmol/L — AB (ref 3.5–5.1)
SODIUM: 136 mmol/L (ref 135–145)

## 2017-04-28 LAB — FOLATE RBC
Folate, Hemolysate: >620 ng/mL
Folate, RBC: >2394 ng/mL (ref >498)
Hematocrit: 25.9 % — ABNORMAL LOW (ref 37.5–51.0)

## 2017-04-28 LAB — PROTEIN ELECTROPHORESIS, SERUM
A/G Ratio: 0.3 — ABNORMAL LOW (ref 0.7–1.7)
ALBUMIN ELP: 1.6 g/dL — AB (ref 2.9–4.4)
ALPHA-1-GLOBULIN: 0.4 g/dL (ref 0.0–0.4)
ALPHA-2-GLOBULIN: 0.9 g/dL (ref 0.4–1.0)
Beta Globulin: 1.2 g/dL (ref 0.7–1.3)
GAMMA GLOBULIN: 2.5 g/dL — AB (ref 0.4–1.8)
Globulin, Total: 5 g/dL — ABNORMAL HIGH (ref 2.2–3.9)
TOTAL PROTEIN ELP: 6.6 g/dL (ref 6.0–8.5)

## 2017-04-28 LAB — HEMOGLOBIN A1C
HEMOGLOBIN A1C: 8.2 % — AB (ref 4.8–5.6)
Mean Plasma Glucose: 188.64 mg/dL

## 2017-04-28 LAB — CBC
HCT: 20.2 % — ABNORMAL LOW (ref 39.0–52.0)
Hemoglobin: 6.6 g/dL — CL (ref 13.0–17.0)
MCH: 27.6 pg (ref 26.0–34.0)
MCHC: 32.7 g/dL (ref 30.0–36.0)
MCV: 84.5 fL (ref 78.0–100.0)
PLATELETS: 339 10*3/uL (ref 150–400)
RBC: 2.39 MIL/uL — AB (ref 4.22–5.81)
RDW: 16.4 % — ABNORMAL HIGH (ref 11.5–15.5)
WBC: 18.8 10*3/uL — AB (ref 4.0–10.5)

## 2017-04-28 LAB — ECHOCARDIOGRAM COMPLETE
Height: 72 in
WEIGHTICAEL: 3432.12 [oz_av]

## 2017-04-28 LAB — HEMOGLOBIN AND HEMATOCRIT, BLOOD
HEMATOCRIT: 27.6 % — AB (ref 39.0–52.0)
HEMOGLOBIN: 9 g/dL — AB (ref 13.0–17.0)

## 2017-04-28 LAB — GLUCOSE, CAPILLARY
GLUCOSE-CAPILLARY: 119 mg/dL — AB (ref 65–99)
GLUCOSE-CAPILLARY: 77 mg/dL (ref 65–99)
Glucose-Capillary: 148 mg/dL — ABNORMAL HIGH (ref 65–99)
Glucose-Capillary: 138 mg/dL — ABNORMAL HIGH (ref 65–99)
Glucose-Capillary: 185 mg/dL — ABNORMAL HIGH (ref 65–99)

## 2017-04-28 LAB — PREPARE RBC (CROSSMATCH)

## 2017-04-28 LAB — ABO/RH: ABO/RH(D): A NEG

## 2017-04-28 MED ORDER — SODIUM CHLORIDE 0.9 % IV SOLN: Freq: Once | INTRAVENOUS | Status: DC

## 2017-04-28 MED ORDER — DEXTROSE 5 % IV SOLN
2.0000 g | Freq: Three times a day (TID) | INTRAVENOUS | Status: DC
  Administered 2017-04-28 – 2017-04-30 (×6): 2 g via INTRAVENOUS
  Filled 2017-04-28 (×7): qty 2

## 2017-04-28 MED ORDER — FLUCONAZOLE IN SODIUM CHLORIDE 200-0.9 MG/100ML-% IV SOLN
200.0000 mg | Freq: Once | INTRAVENOUS | Status: AC
  Administered 2017-04-28: 200 mg via INTRAVENOUS
  Filled 2017-04-28: qty 100

## 2017-04-28 MED ORDER — COLLAGENASE 250 UNIT/GM EX OINT
TOPICAL_OINTMENT | Freq: Every day | CUTANEOUS | Status: DC
  Administered 2017-04-29 – 2017-05-02 (×4): via TOPICAL
  Filled 2017-04-28: qty 90
  Filled 2017-04-28: qty 30

## 2017-04-28 MED ORDER — LIDOCAINE HCL (PF) 2 % IJ SOLN
0.0000 mL | Freq: Once | INTRAMUSCULAR | Status: DC | PRN
  Filled 2017-04-28: qty 20

## 2017-04-28 MED ORDER — POTASSIUM CHLORIDE CRYS ER 20 MEQ PO TBCR
40.0000 meq | EXTENDED_RELEASE_TABLET | Freq: Two times a day (BID) | ORAL | Status: AC
  Administered 2017-04-28 (×2): 40 meq via ORAL
  Filled 2017-04-28 (×2): qty 2

## 2017-04-28 MED ORDER — SODIUM CHLORIDE 0.9 % IV BOLUS (SEPSIS)
1000.0000 mL | Freq: Once | INTRAVENOUS | Status: AC
  Administered 2017-04-28: 1000 mL via INTRAVENOUS

## 2017-04-28 MED ORDER — PREMIER PROTEIN SHAKE
11.0000 [oz_av] | ORAL | Status: DC
  Administered 2017-04-28 – 2017-05-01 (×4): 11 [oz_av] via ORAL
  Filled 2017-04-28 (×5): qty 325.31

## 2017-04-28 MED ORDER — CHLORHEXIDINE GLUCONATE CLOTH 2 % EX PADS
6.0000 | MEDICATED_PAD | Freq: Every day | CUTANEOUS | Status: AC
  Administered 2017-04-28 – 2017-04-29 (×2): 6 via TOPICAL
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
## 2017-04-28 NOTE — Progress Notes (Addendum)
Pharmacy Antibiotic Note  [name] is a 36 y.o. male admitted on 04/25/2017 with Wound infection.  Pharmacy has been consulted for Vancomycin and cefepime. **update cefepime only intermediate susceptibilities, change to ceftazidime for bacteremia**  04/28/2017 WBC 18.8 Scr 1.37, CrCl ~ 57mls/min (N) Afebrile  Plan: -Obtain vanc trough tonight and adjust vanc dose accordingly, currently 1gm IV q12h - d/c cefepime and start ceftazidime 2gm IV q8h - follow renal function and clinical course  Height: 6' (182.9 cm) Weight: 214 lb 8.1 oz (97.3 kg) IBW/kg (Calculated) : 77.6  Temp (24hrs), Avg:98.7 F (37.1 C), Min:98.2 F (36.8 C), Max:99.1 F (37.3 C)   Recent Labs Lab 04/26/17 0111 04/26/17 0118 04/26/17 0656 04/26/17 1021 04/26/17 2102 04/27/17 0108 04/27/17 1415 04/28/17 0514  WBC 18.4*  --  20.1*  --   --   --  19.7* 18.8*  CREATININE 1.22  --  1.37* 1.43* 1.38* 1.38*  --  1.37*  LATICACIDVEN  --  0.93  --   --   --   --   --   --     Estimated Creatinine Clearance: 91.9 mL/min (A) (by C-G formula based on SCr of 1.37 mg/dL (H)).    Allergies  Allergen Reactions  . Lisinopril     Per MAR    Antimicrobials this admission: Vancomycin 10/13 >> Zosyn 10/13 >> 10/14 Cefepime 10/14 >> 10/15 Ceftazidime 10/15 >>  Dose adjustments this admission: 10/15 @ 2130=____  Microbiology results: 10/13 blood: pseudomonas 10/13 MRSA PCR: positive   Thank you for allowing pharmacy to be a part of this patient's care.  Arley Phenix RPh 04/28/2017, 12:54 PM Pager [phone]

## 2017-04-28 NOTE — Progress Notes (Signed)
CRITICAL VALUE ALERT  Critical Value:  Hgb 6.6  Date & Time Notied:  10/15 0545  Provider Notified: Oypd  Orders Received/Actions taken: New orders to transfuse one unit of blood
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
## 2017-04-28 NOTE — Consult Note (Signed)
WOC Nurse wound consult note Reason for Consult: Consult requested for multiple wounds,  Surgery has consulted earlier for sacrum wound and feels that pt could benefit from hydrotherapy; refer to their progress notes.  Pt states he had flap surgery "awhile ago at [facility]" and wound has significantly declined recently. Landmarks are irregular related to previous surgery and scar tissue. Wound type: Sacrum with stage 4 pressure injury; 7X6X5cm, approx 40% areas of eschar, 60% dark red.  Mod amt tan-green tinted drainage Bilat buttocks/posterior thighs below this site with red moist wounds located over previous scar tissue which has re-opened; affected areas approx 10X8X.2cm to each side, mod amt pink drainage, no odor Left posterior leg with stage 3 pressure injury which has healed except one location; 2X1X.2cm, located in a deep valley surrounding it;  red with small amt tan drainage, no odor Right plantar foot with unstageable pressure injury; 50% red, 50% black loose eschar; 2X2X.2cm, small amt tan drainage, no odor Right toe with full thickness wound; 1X1X.2cm, red and moist with small amt tan drainage, no odor. One piece flat urostomy pouch intact with good seal; stoma is red and viable, slightly above skin level when visualized through pouch.  2 piece colostomy pouch intact with good seal; mod amt semi formed brown stool Pressure Injury POA: Yes Pt is on an air mattress to reduce pressure.  Extra pouching supplies in the room for patient or bedside nurse use.  PT to begin hydrotherapy to sacrum wound to assist with removal of nonviable tissue, and Santyl ordered for enzymatic debridement.  Moist gauze dressing to other wound locations.  Discussed plan of care with patient and he verbalized understanding.   One hour spent performing this consult. Please re-consult if further assistance is needed.  Thank-you,  Cammie Mcgee MSN, RN, CWOCN, [place], CNS [phone]

## 2017-04-28 NOTE — Progress Notes (Signed)
Echocardiogram 2D Echocardiogram has been performed.  [name] 04/28/2017, 12:44 PM

## 2017-04-28 NOTE — Progress Notes (Signed)
Rx brief note:  Vancomycin VT was due at 2130.  RN hung Vancomycin at 2140 b/f the VT was drawn.  Pharmacy has ordered VT for 10/16 0930.   Thanks Lorenza Evangelist 04/28/2017 11:12 PM
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
## 2017-04-28 NOTE — Progress Notes (Signed)
Patient had a BP of 86/52. HR 120's. Patient was also more confused and having hallucinations. On call made aware and new orders were given for a 1L bolus and strict I and O's. After bolus BP was 90/49.

## 2017-04-28 NOTE — Progress Notes (Signed)
PT Note  Patient Details Name: [name] MRN: [id number] DOB: [birth year]      Noted new Hydro order for this. Cammie Mcgee spoke with our office manager and stated we could begin tomorrow. Will call nurse tomorrow morning o discuss best time for patient.   Thank you,    Marella Bile 04/28/2017, 2:36 PM  Marella Bile, PT Pager: [phone] 04/28/2017

## 2017-04-29 DIAGNOSIS — R7881 Bacteremia: Secondary | ICD-10-CM

## 2017-04-29 DIAGNOSIS — N179 Acute kidney failure, unspecified: Secondary | ICD-10-CM | POA: Diagnosis present

## 2017-04-29 DIAGNOSIS — N189 Chronic kidney disease, unspecified: Secondary | ICD-10-CM

## 2017-04-29 DIAGNOSIS — B965 Pseudomonas (aeruginosa) (mallei) (pseudomallei) as the cause of diseases classified elsewhere: Secondary | ICD-10-CM

## 2017-04-29 LAB — TYPE AND SCREEN
ABO/RH(D): A NEG
ANTIBODY SCREEN: NEGATIVE
Unit division: 0

## 2017-04-29 LAB — BASIC METABOLIC PANEL WITH GFR
Anion gap: 7 (ref 5–15)
BUN: 34 mg/dL — ABNORMAL HIGH (ref 6–20)
CO2: 12 mmol/L — ABNORMAL LOW (ref 22–32)
Calcium: 8.3 mg/dL — ABNORMAL LOW (ref 8.9–10.3)
Chloride: 118 mmol/L — ABNORMAL HIGH (ref 101–111)
Creatinine, Ser: 1.49 mg/dL — ABNORMAL HIGH (ref 0.61–1.24)
GFR calc Af Amer: >60 mL/min
GFR calc non Af Amer: 60 mL/min — ABNORMAL LOW
Glucose, Bld: 247 mg/dL — ABNORMAL HIGH (ref 65–99)
Potassium: 3.9 mmol/L (ref 3.5–5.1)
Sodium: 137 mmol/L (ref 135–145)

## 2017-04-29 LAB — CBC
HCT: 23.5 % — ABNORMAL LOW (ref 39.0–52.0)
Hemoglobin: 7.6 g/dL — ABNORMAL LOW (ref 13.0–17.0)
MCH: 27 pg (ref 26.0–34.0)
MCHC: 32.3 g/dL (ref 30.0–36.0)
MCV: 83.6 fL (ref 78.0–100.0)
Platelets: 343 10*3/uL (ref 150–400)
RBC: 2.81 MIL/uL — ABNORMAL LOW (ref 4.22–5.81)
RDW: 16.1 % — AB (ref 11.5–15.5)
WBC: 18.8 10*3/uL — ABNORMAL HIGH (ref 4.0–10.5)

## 2017-04-29 LAB — BPAM RBC
BLOOD PRODUCT EXPIRATION DATE: 201810262359
ISSUE DATE / TIME: 201810151317
Unit Type and Rh: 600

## 2017-04-29 LAB — GLUCOSE, CAPILLARY
GLUCOSE-CAPILLARY: 224 mg/dL — AB (ref 65–99)
Glucose-Capillary: 226 mg/dL — ABNORMAL HIGH (ref 65–99)
Glucose-Capillary: 173 mg/dL — ABNORMAL HIGH (ref 65–99)
Glucose-Capillary: 135 mg/dL — ABNORMAL HIGH (ref 65–99)

## 2017-04-29 LAB — HEMOGLOBIN AND HEMATOCRIT, BLOOD
HCT: 23.9 % — ABNORMAL LOW (ref 39.0–52.0)
Hemoglobin: 8 g/dL — ABNORMAL LOW (ref 13.0–17.0)

## 2017-04-29 LAB — VANCOMYCIN, TROUGH: VANCOMYCIN TR: 56 ug/mL — AB (ref 15–20)

## 2017-04-29 MED ORDER — SODIUM BICARBONATE 8.4 % IV SOLN
INTRAVENOUS | Status: DC
  Administered 2017-04-29 – 2017-05-01 (×4): via INTRAVENOUS
  Filled 2017-04-29 (×4): qty 150

## 2017-04-29 MED ORDER — INSULIN DETEMIR 100 UNIT/ML ~~LOC~~ SOLN
25.0000 [IU] | Freq: Every day | SUBCUTANEOUS | Status: DC
  Administered 2017-04-30: 25 [IU] via SUBCUTANEOUS
  Filled 2017-04-29 (×2): qty 0.25

## 2017-04-29 MED ORDER — OXYCODONE HCL 5 MG PO TABS
20.0000 mg | ORAL_TABLET | Freq: Four times a day (QID) | ORAL | Status: DC | PRN
  Administered 2017-04-30 – 2017-05-03 (×2): 20 mg via ORAL
  Filled 2017-04-29 (×3): qty 4

## 2017-04-29 MED ORDER — HYDROCERIN EX CREA
TOPICAL_CREAM | Freq: Two times a day (BID) | CUTANEOUS | Status: DC
  Administered 2017-04-29 – 2017-05-03 (×8): via TOPICAL
  Filled 2017-04-29: qty 113
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
## 2017-04-29 NOTE — Consult Note (Signed)
WOC Nurse wound consult note Reason for Consult: Asked to evaluate posterior thigh wounds not addressed in yesterday's consult by PT performing hydrotherapy.  Posterior thigh wounds with previous grafts, right has defect and left is more superficial.  Xeroform orders provided for left, saline moistened gauze roll used to fill defect is ordered for right. WOC nursing team will not follow, but will remain available to this patient, the nursing and medical teams.  Please re-consult if needed. Thanks, Ladona Mow, MSN, RN, GNP, [place]  Pager# [phone]
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
## 2017-04-29 NOTE — Care Management Important Message (Addendum)
Important Message  Patient Details IM Letter given to Cookie/Case Manager to present to Patient Name: [name] MRN: [id number] Date of Birth: 02-16-1981   Medicare Important Message Given:  Yes    Caren Macadam 04/29/2017, 11:31 AMImportant Message  Patient Details  Name: [name] MRN: [id number] Date of Birth: 12/01/1980   Medicare Important Message Given:  Yes    Caren Macadam 04/29/2017, 11:30 AM

## 2017-04-29 NOTE — Progress Notes (Signed)
Inpatient Diabetes Program Recommendations  AACE/ADA: New Consensus Statement on Inpatient Glycemic Control (2015)  Target Ranges:  Prepandial:   less than 140 mg/dL      Peak postprandial:   less than 180 mg/dL (1-2 hours)      Critically ill patients:  140 - 180 mg/dL   Results for SAJJAD, HONEA (MRN [id number]) as of 04/29/2017 10:16  Ref. Range 04/28/2017 07:49 04/28/2017 12:06 04/28/2017 17:27 04/28/2017 21:39 04/29/2017 07:44  Glucose-Capillary Latest Ref Range: 65 - 99 mg/dL 77 562 (H) 130 (H) 865 (H) 226 (H)   Review of Glycemic Control  Diabetes history: DM 2  Outpatient Diabetes medications: Levemir 40 units BID, Humalog 2-12 units tid, Actos 30 mg Daily, Metformin 500 mg BID, Victoza 0.6 Daily Current orders for Inpatient glycemic control: Levemir 20 units Daily, Novolog Sensitive 0-9 units tid + Novolog hs scale 0-5 units  A1c 8.2% on 10/15  Inpatient Diabetes Program Recommendations:    Glucose elevated this am. Patient on significantly more Levemir at home. Consider BID dosing of Levemir 20 units.  Thanks,  Christena Deem RN, MSN, [facility] Inpatient Diabetes Coordinator Team Pager [phone] (8a-5p)
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
## 2017-04-30 ENCOUNTER — Inpatient Hospital Stay (HOSPITAL_COMMUNITY): Payer: Medicare Other

## 2017-04-30 LAB — CBC
HCT: 22.4 % — ABNORMAL LOW (ref 39.0–52.0)
HEMOGLOBIN: 7.4 g/dL — AB (ref 13.0–17.0)
MCH: 27.4 pg (ref 26.0–34.0)
MCHC: 33 g/dL (ref 30.0–36.0)
MCV: 83 fL (ref 78.0–100.0)
Platelets: 328 10*3/uL (ref 150–400)
RBC: 2.7 MIL/uL — AB (ref 4.22–5.81)
RDW: 16.5 % — ABNORMAL HIGH (ref 11.5–15.5)
WBC: 17.4 10*3/uL — ABNORMAL HIGH (ref 4.0–10.5)

## 2017-04-30 LAB — BASIC METABOLIC PANEL
ANION GAP: 7 (ref 5–15)
BUN: 32 mg/dL — ABNORMAL HIGH (ref 6–20)
CHLORIDE: 116 mmol/L — AB (ref 101–111)
CO2: 17 mmol/L — ABNORMAL LOW (ref 22–32)
Calcium: 8.5 mg/dL — ABNORMAL LOW (ref 8.9–10.3)
Creatinine, Ser: 1.49 mg/dL — ABNORMAL HIGH (ref 0.61–1.24)
GFR calc non Af Amer: 60 mL/min — ABNORMAL LOW (ref >60)
Glucose, Bld: 280 mg/dL — ABNORMAL HIGH (ref 65–99)
POTASSIUM: 3.9 mmol/L (ref 3.5–5.1)
SODIUM: 140 mmol/L (ref 135–145)

## 2017-04-30 LAB — GLUCOSE, CAPILLARY
GLUCOSE-CAPILLARY: 285 mg/dL — AB (ref 65–99)
GLUCOSE-CAPILLARY: 160 mg/dL — AB (ref 65–99)
GLUCOSE-CAPILLARY: 126 mg/dL — AB (ref 65–99)
Glucose-Capillary: 249 mg/dL — ABNORMAL HIGH (ref 65–99)

## 2017-04-30 MED ORDER — INSULIN ASPART 100 UNIT/ML ~~LOC~~ SOLN
0.0000 [IU] | Freq: Three times a day (TID) | SUBCUTANEOUS | Status: DC
  Administered 2017-04-30 – 2017-05-03 (×3): 3 [IU] via SUBCUTANEOUS

## 2017-04-30 MED ORDER — INSULIN ASPART 100 UNIT/ML ~~LOC~~ SOLN: 0.0000 [IU] | Freq: Every day | SUBCUTANEOUS | Status: DC

## 2017-04-30 MED ORDER — PIPERACILLIN-TAZOBACTAM 3.375 G IVPB
3.3750 g | Freq: Three times a day (TID) | INTRAVENOUS | Status: DC
  Administered 2017-04-30 – 2017-05-03 (×9): 3.375 g via INTRAVENOUS
  Filled 2017-04-30 (×9): qty 50

## 2017-04-30 MED ORDER — INSULIN DETEMIR 100 UNIT/ML ~~LOC~~ SOLN
30.0000 [IU] | Freq: Every day | SUBCUTANEOUS | Status: DC
  Administered 2017-04-30 – 2017-05-01 (×2): 30 [IU] via SUBCUTANEOUS
  Filled 2017-04-30 (×2): qty 0.3

## 2017-04-30 MED ORDER — BIOTENE DRY MOUTH MT LIQD: 15.0000 mL | OROMUCOSAL | Status: DC | PRN
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
## 2017-04-30 NOTE — Progress Notes (Signed)
Inpatient Diabetes Program Recommendations  AACE/ADA: New Consensus Statement on Inpatient Glycemic Control (2015)  Target Ranges:  Prepandial:   less than 140 mg/dL      Peak postprandial:   less than 180 mg/dL (1-2 hours)      Critically ill patients:  140 - 180 mg/dL   Lab Results  Component Value Date   GLUCAP 285 (H) 04/30/2017   HGBA1C 8.2 (H) 04/28/2017    Review of Glycemic Control  Blood sugars improving although still elevated.  Inpatient Diabetes Program Recommendations:    Continue to titrate Levemir to 30 units QHS. Add Novolog 3 units tidwc for meal coverage insulin if pt eats > 50% meal.  Continue to follow.  Thank you. Ailene Ardshonda Trajon Rosete, RD, LDN, CDE Inpatient Diabetes Coordinator [phone]

## 2017-04-30 NOTE — Progress Notes (Deleted)
Inpatient Diabetes Program Recommendations  AACE/ADA: New Consensus Statement on Inpatient Glycemic Control (2015)  Target Ranges:  Prepandial:   less than 140 mg/dL      Peak postprandial:   less than 180 mg/dL (1-2 hours)      Critically ill patients:  140 - 180 mg/dL   Lab Results  Component Value Date   GLUCAP 285 (H) 04/30/2017   HGBA1C 8.2 (H) 04/28/2017    Review of Glycemic Control  Blood sugars continue to run > 180 mg/dL. Needs insulin adjustment for tighter glycemic control.   Inpatient Diabetes Program Recommendations:   Increase Levemir to 40 units QHS Add Novolog 3 units tidwc for meal coverage insulin if pt eats > 50% meal.  Continue to follow.  Thank you. Ailene Ardshonda Braulio Kiedrowski, RD, LDN, CDE Inpatient Diabetes Coordinator [phone]
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
## 2017-04-30 NOTE — NC FL2 (Signed)
[place] MEDICAID FL2 LEVEL OF CARE SCREENING TOOL     IDENTIFICATION  Patient Name: [name] Birthdate: 03/31/1981 Sex: male Admission Date (Current Location): 04/25/2017  [facility] [place]   Facility and Address:  [facility],  [address], [place] [id number]      Provider Number: [id number]  Attending Physician Name and Address:  Erick BlinksShah, Pratik D, DO  Relative Name and Phone Number:       Current Level of Care: Hospital Recommended Level of Care: Skilled Nursing Facility Prior Approval Number:    Date Approved/Denied:   PASRR Number:    Discharge Plan: SNF    Current Diagnoses: Patient Active Problem List   Diagnosis Date Noted  . Acute kidney injury superimposed on CKD (HCC) 04/29/2017  . Bacteremia due to Pseudomonas 04/29/2017  . DKA (diabetic ketoacidoses) (HCC) 04/26/2017  . Hypokalemia 04/26/2017  . Skin ulcer (HCC) 04/26/2017  . Prolonged QT interval 04/26/2017  . Hematuria     Orientation RESPIRATION BLADDER Height & Weight     Self, Time, Situation, Place  Normal Urostomy Weight: 214 lb 8.1 oz (97.3 kg) Height:  6' (182.9 cm)  BEHAVIORAL SYMPTOMS/MOOD NEUROLOGICAL BOWEL NUTRITION STATUS      Colostomy Diet (Carb modified )  AMBULATORY STATUS COMMUNICATION OF NEEDS Skin   Total Care Verbally Other (Comment) (Pressure injury: Stage 5 sacrum, stage 3 leg, unstageable foot. Incision- abdomen medial/ lower )                       Personal Care Assistance Level of Assistance  Bathing, Feeding, Dressing, Total care Bathing Assistance: Limited assistance Feeding assistance: Independent Dressing Assistance: Maximum assistance Total Care Assistance: Limited assistance   Functional Limitations Info             SPECIAL CARE FACTORS FREQUENCY  PT (By licensed PT), OT (By licensed OT)     PT Frequency: 5 OT Frequency: 5            Contractures      Additional Factors Info  Code Status,  Allergies Code Status Info: Full Code  Allergies Info: LISINOPRIL            Current Medications (04/30/2017):  This is the current hospital active medication list Current Facility-Administered Medications  Medication Dose Route Frequency Provider Last Rate Last Dose  . antiseptic oral rinse (BIOTENE) solution 15 mL  15 mL Mouth Rinse PRN Sherryll BurgerShah, Pratik D, DO      . apixaban Everlene Balls(ELIQUIS) tablet 2.5 mg  2.5 mg Oral BID Pearson GrippeKim, James, MD   2.5 mg at 04/30/17 1101  . cefTAZidime (FORTAZ) 2 g in dextrose 5 % 50 mL IVPB  2 g Intravenous Q8H Maurice MarchJackson, Rachel E, [facility]   Stopped at 04/30/17 1129  . Chlorhexidine Gluconate Cloth 2 % PADS 6 each  6 each Topical Q0600 Kathlen ModyAkula, Vijaya, MD   6 each at 04/29/17 0600  . collagenase (SANTYL) ointment   Topical Daily Kathlen ModyAkula, Vijaya, MD      . cyclobenzaprine (FLEXERIL) tablet 10 mg  10 mg Oral BID PRN Pearson GrippeKim, James, MD   10 mg at 04/30/17 1100  . dicyclomine (BENTYL) tablet 20 mg  20 mg Oral BID Pearson GrippeKim, James, MD   20 mg at 04/30/17 1102  . docusate sodium (COLACE) capsule 100 mg  100 mg Oral Daily Pearson GrippeKim, James, MD   100 mg at 04/30/17 1100  . feeding supplement (PRO-STAT SUGAR FREE 64) liquid 30  mL  30 mL Oral TID WC Pearson Grippe, MD   30 mL at 04/30/17 1200  . ferrous sulfate tablet 325 mg  325 mg Oral BID WC Pearson Grippe, MD   325 mg at 04/30/17 1101  . folic acid (FOLVITE) tablet 1 mg  1 mg Oral Daily Pearson Grippe, MD   1 mg at 04/30/17 1101  . hydrocerin (EUCERIN) cream   Topical BID Kathlen Mody, MD      . hydrOXYzine (ATARAX/VISTARIL) tablet 25 mg  25 mg Oral TID PRN Briscoe Deutscher, MD   25 mg at 04/27/17 2111  . insulin aspart (novoLOG) injection 0-15 Units  0-15 Units Subcutaneous TID WC Shah, Pratik D, DO      . insulin aspart (novoLOG) injection 0-5 Units  0-5 Units Subcutaneous QHS Shah, Pratik D, DO      . insulin detemir (LEVEMIR) injection 30 Units  30 Units Subcutaneous QHS Shah, Pratik D, DO      . lidocaine (XYLOCAINE) 2 % injection 0-20 mL  0-20 mL Intradermal  Once PRN Sherrie George, PA-C      . morphine 4 MG/ML injection 1-2 mg  1-2 mg Intravenous Q4H PRN Kathlen Mody, MD   2 mg at 04/30/17 1059  . mupirocin ointment (BACTROBAN) 2 % 1 application  1 application Nasal BID Kathlen Mody, MD   1 application at 04/30/17 1000  . nystatin (MYCOSTATIN) 100000 UNIT/ML suspension 500,000 Units  5 mL Oral QID Pearson Grippe, MD   500,000 Units at 04/30/17 1400  . olopatadine (PATANOL) 0.1 % ophthalmic solution 1 drop  1 drop Both Eyes BID Pearson Grippe, MD   1 drop at 04/30/17 1000  . oxyCODONE (Oxy IR/ROXICODONE) immediate release tablet 20 mg  20 mg Oral Q6H PRN Kathlen Mody, MD   20 mg at 04/30/17 0005  . protein supplement (PREMIER PROTEIN) liquid  11 oz Oral Q24H Kathlen Mody, MD   11 oz at 04/30/17 1400  . sodium bicarbonate 150 mEq in dextrose 5 % 1,000 mL infusion   Intravenous Continuous Kathlen Mody, MD 75 mL/hr at 04/30/17 0301    . sodium chloride flush (NS) 0.9 % injection 10-40 mL  10-40 mL Intracatheter PRN Kathlen Mody, MD   10 mL at 04/30/17 0457  . vitamin C (ASCORBIC ACID) tablet 500 mg  500 mg Oral BID Pearson Grippe, MD   500 mg at 04/30/17 1100     Discharge Medications: Please see discharge summary for a list of discharge medications.  Relevant Imaging Results:  Relevant Lab Results:   Additional Information    Donnie Coffin, LCSW
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
## 2017-05-01 LAB — BASIC METABOLIC PANEL
ANION GAP: 9 (ref 5–15)
BUN: 28 mg/dL — ABNORMAL HIGH (ref 6–20)
CHLORIDE: 117 mmol/L — AB (ref 101–111)
CO2: 18 mmol/L — ABNORMAL LOW (ref 22–32)
Calcium: 8.3 mg/dL — ABNORMAL LOW (ref 8.9–10.3)
Creatinine, Ser: 1.38 mg/dL — ABNORMAL HIGH (ref 0.61–1.24)
GFR calc non Af Amer: >60 mL/min (ref >60)
Glucose, Bld: 110 mg/dL — ABNORMAL HIGH (ref 65–99)
POTASSIUM: 3.1 mmol/L — AB (ref 3.5–5.1)
SODIUM: 144 mmol/L (ref 135–145)

## 2017-05-01 LAB — CBC
HCT: 23.2 % — ABNORMAL LOW (ref 39.0–52.0)
HEMOGLOBIN: 7.6 g/dL — AB (ref 13.0–17.0)
MCH: 27.3 pg (ref 26.0–34.0)
MCHC: 32.8 g/dL (ref 30.0–36.0)
MCV: 83.5 fL (ref 78.0–100.0)
Platelets: 340 10*3/uL (ref 150–400)
RBC: 2.78 MIL/uL — AB (ref 4.22–5.81)
RDW: 16.4 % — ABNORMAL HIGH (ref 11.5–15.5)
WBC: 16.4 10*3/uL — AB (ref 4.0–10.5)

## 2017-05-01 LAB — GLUCOSE, CAPILLARY
GLUCOSE-CAPILLARY: 93 mg/dL (ref 65–99)
Glucose-Capillary: 96 mg/dL (ref 65–99)
Glucose-Capillary: 105 mg/dL — ABNORMAL HIGH (ref 65–99)
Glucose-Capillary: 149 mg/dL — ABNORMAL HIGH (ref 65–99)

## 2017-05-01 LAB — HEMOGLOBIN AND HEMATOCRIT, BLOOD
HEMATOCRIT: 23.3 % — AB (ref 39.0–52.0)
HEMOGLOBIN: 7.6 g/dL — AB (ref 13.0–17.0)

## 2017-05-01 LAB — MAGNESIUM: Magnesium: 1.6 mg/dL — ABNORMAL LOW (ref 1.7–2.4)

## 2017-05-01 MED ORDER — ONDANSETRON HCL 4 MG/2ML IJ SOLN
4.0000 mg | Freq: Four times a day (QID) | INTRAMUSCULAR | Status: DC | PRN
  Administered 2017-05-01 – 2017-05-03 (×4): 4 mg via INTRAVENOUS
  Filled 2017-05-01 (×3): qty 2

## 2017-05-01 MED ORDER — POTASSIUM CHLORIDE 10 MEQ/100ML IV SOLN
10.0000 meq | INTRAVENOUS | Status: AC
  Administered 2017-05-01 (×4): 10 meq via INTRAVENOUS
  Filled 2017-05-01 (×4): qty 100
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
## 2017-05-02 LAB — CBC
HEMATOCRIT: 23 % — AB (ref 39.0–52.0)
HEMOGLOBIN: 7.5 g/dL — AB (ref 13.0–17.0)
MCH: 27.3 pg (ref 26.0–34.0)
MCHC: 32.6 g/dL (ref 30.0–36.0)
MCV: 83.6 fL (ref 78.0–100.0)
Platelets: 318 10*3/uL (ref 150–400)
RBC: 2.75 MIL/uL — ABNORMAL LOW (ref 4.22–5.81)
RDW: 16.6 % — ABNORMAL HIGH (ref 11.5–15.5)
WBC: 11.7 10*3/uL — ABNORMAL HIGH (ref 4.0–10.5)

## 2017-05-02 LAB — BASIC METABOLIC PANEL
Anion gap: 7 (ref 5–15)
BUN: 29 mg/dL — AB (ref 6–20)
CHLORIDE: 113 mmol/L — AB (ref 101–111)
CO2: 21 mmol/L — AB (ref 22–32)
CREATININE: 1.33 mg/dL — AB (ref 0.61–1.24)
Calcium: 7.7 mg/dL — ABNORMAL LOW (ref 8.9–10.3)
GFR calc Af Amer: >60 mL/min (ref >60)
GFR calc non Af Amer: >60 mL/min (ref >60)
GLUCOSE: 145 mg/dL — AB (ref 65–99)
Potassium: 3 mmol/L — ABNORMAL LOW (ref 3.5–5.1)
Sodium: 141 mmol/L (ref 135–145)

## 2017-05-02 LAB — GLUCOSE, CAPILLARY
GLUCOSE-CAPILLARY: 77 mg/dL (ref 65–99)
GLUCOSE-CAPILLARY: 71 mg/dL (ref 65–99)
Glucose-Capillary: 111 mg/dL — ABNORMAL HIGH (ref 65–99)
Glucose-Capillary: 112 mg/dL — ABNORMAL HIGH (ref 65–99)

## 2017-05-02 MED ORDER — INSULIN DETEMIR 100 UNIT/ML ~~LOC~~ SOLN
25.0000 [IU] | Freq: Every day | SUBCUTANEOUS | Status: DC
  Administered 2017-05-02: 25 [IU] via SUBCUTANEOUS
  Filled 2017-05-02 (×2): qty 0.25

## 2017-05-02 MED ORDER — POTASSIUM CHLORIDE 10 MEQ/100ML IV SOLN
10.0000 meq | INTRAVENOUS | Status: AC
  Administered 2017-05-02 (×4): 10 meq via INTRAVENOUS
  Filled 2017-05-02 (×4): qty 100

## 2017-05-02 MED ORDER — METOCLOPRAMIDE HCL 5 MG PO TABS
5.0000 mg | ORAL_TABLET | Freq: Three times a day (TID) | ORAL | Status: DC
  Administered 2017-05-02 – 2017-05-03 (×6): 5 mg via ORAL
  Filled 2017-05-02 (×5): qty 1

## 2017-05-02 MED ORDER — POTASSIUM CHLORIDE CRYS ER 20 MEQ PO TBCR
40.0000 meq | EXTENDED_RELEASE_TABLET | Freq: Two times a day (BID) | ORAL | Status: DC
  Filled 2017-05-02: qty 2

## 2017-05-02 MED ORDER — PANTOPRAZOLE SODIUM 40 MG IV SOLR
40.0000 mg | INTRAVENOUS | Status: DC
  Administered 2017-05-02 (×2): 40 mg via INTRAVENOUS
  Filled 2017-05-02: qty 40

## 2017-05-02 MED ORDER — PREMIER PROTEIN SHAKE
11.0000 [oz_av] | Freq: Two times a day (BID) | ORAL | Status: DC
  Administered 2017-05-02 – 2017-05-03 (×3): 11 [oz_av] via ORAL
  Filled 2017-05-02 (×3): qty 325.31

## 2017-05-02 NOTE — Progress Notes (Signed)
Enc pt to sip of liquids, unable to take PO meds.... including oral potassium this am, MD notified, IV potassium ordered will cont to monitor. SRP, RN
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
## 2017-05-02 NOTE — Progress Notes (Signed)
Pt nauseated refused oral med. Gave meds this am and spit out stating I will not be able to keep down, took few sips of ginger ale. Administered med for nausea and along with pain med. SRP, RN
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
## 2017-05-02 NOTE — Progress Notes (Addendum)
Pt was given Zofran prior to each Mophine administeration and pt noted with decreased nausea. Able to tol  lunch and dinner. Will cont to monitor nausea and update on coming RN. SRP, RN

## 2017-05-02 NOTE — Progress Notes (Signed)
   05/02/17  Hydrotherapy  Treatment Note  Subjective Assessment  Subjective Pt's nickname is [name].    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  Wound Description (Comments): HYDROTHERAPY  Present on Adm...  Dressing Type ABD;Moist to moist;Paper tape;Gauze (Comment);Barrier Film (skin prep);Impregnated gauze (bismuth) (kerlix, santyl (bismuth for surrounding area))  Dressing Changed  Dressing Change Frequency Daily  State of Healing Eschar  Site / Wound Assessment Bleeding;Red;Black;Brown  % Wound base Red or Granulating 75%  % Wound base Black/Eschar 25%  Peri-wound Assessment Bleeding;Denuded (around sacrum areas of above also area of intact skin )  Wound Length (cm) 5 cm  Wound Width (cm) 10 cm  Wound Depth (cm) 5 cm  Wound Surface Area (cm^2) 50 cm^2  Wound Volume (cm^3) 250 cm^3  Margins Unattached edges (unapproximated)  Drainage Amount Copious  Drainage Description Odor;Serosanguineous (tan)  Treatment Debridement (Selective);Hydrotherapy (Pulse lavage);Packing (Saline gauze) (santyl)  Hydrotherapy  Pulsed lavage therapy - wound location sacral pressure injury  Pulsed Lavage with Suction (psi) 8 psi  Pulsed Lavage with Suction - Normal Saline Used 1000 mL  Pulsed Lavage Tip Tip with splash shield  Selective Debridement  Selective Debridement - Location 3-5 o'clock area of sacral pressure injury  Selective Debridement - Tools Used  Forceps;Scissors  Selective Debridement - Tissue Removed eschar  Wound Therapy - Assess/Plan/Recommendations  Wound Therapy - Clinical Statement The pink wouunds have multiple sites of bleeding throughout.  CCS PA in to inspect wound. recommended to not attempt removal of what appears eschar  at the wound at 6 o'clock as the tissue is now soft. Redressed entire buttock and right thigh.  Wound Therapy - Functional Problem List immobility, DM, other areas of nonintact skin, paraplegia  Factors Delaying/Impairing Wound Healing Diabetes Mellitus;Altered sensation;Immobility  Hydrotherapy Plan Debridement;Dressing change;Patient/family education;Pulsatile lavage with suction  Wound Therapy - Frequency 6X / week  Wound Therapy - Follow Up Recommendations Skilled nursing facility  Wound Plan Perform hydrotherapy as outlined in evaluation to sacral pressure injury to remove necrotic tissue and decrease bioburden of wound to promote wound stability/healing  Wound Therapy Goals - Improve the function of patient's integumentary system by progressing the wound(s) through the phases of wound healing by:  Decrease Necrotic Tissue to 0  Decrease Necrotic Tissue - Progress Progressing toward goal  Increase Granulation Tissue to 100  Increase Granulation Tissue - Progress Progressing toward goal  Improve Drainage Characteristics Mod  Improve Drainage Characteristics - Progress Progressing toward goal  Goals/treatment plan/discharge plan were made with and agreed upon by patient/family Yes  Time For Goal Achievement 2 weeks  Wound Therapy - Potential for Goals Truitt LeepGood  Megan Hayduk PT [phone]

## 2017-05-03 DIAGNOSIS — L89154 Pressure ulcer of sacral region, stage 4: Secondary | ICD-10-CM

## 2017-05-03 DIAGNOSIS — Z933 Colostomy status: Secondary | ICD-10-CM

## 2017-05-03 DIAGNOSIS — G8220 Paraplegia, unspecified: Secondary | ICD-10-CM

## 2017-05-03 LAB — GLUCOSE, CAPILLARY
Glucose-Capillary: 62 mg/dL — ABNORMAL LOW (ref 65–99)
Glucose-Capillary: 108 mg/dL — ABNORMAL HIGH (ref 65–99)
Glucose-Capillary: 172 mg/dL — ABNORMAL HIGH (ref 65–99)
Glucose-Capillary: 183 mg/dL — ABNORMAL HIGH (ref 65–99)

## 2017-05-03 LAB — BASIC METABOLIC PANEL
Anion gap: 8 (ref 5–15)
BUN: 30 mg/dL — ABNORMAL HIGH (ref 6–20)
CALCIUM: 7.4 mg/dL — AB (ref 8.9–10.3)
CO2: 19 mmol/L — ABNORMAL LOW (ref 22–32)
Chloride: 115 mmol/L — ABNORMAL HIGH (ref 101–111)
Creatinine, Ser: 1.29 mg/dL — ABNORMAL HIGH (ref 0.61–1.24)
GFR calc Af Amer: >60 mL/min (ref >60)
Glucose, Bld: 94 mg/dL (ref 65–99)
Potassium: 3.6 mmol/L (ref 3.5–5.1)
Sodium: 142 mmol/L (ref 135–145)

## 2017-05-03 LAB — CULTURE, BLOOD (ROUTINE X 2)
CULTURE: NO GROWTH
CULTURE: NO GROWTH
SPECIAL REQUESTS: ADEQUATE
Special Requests: ADEQUATE

## 2017-05-03 LAB — CBC
HCT: 21.9 % — ABNORMAL LOW (ref 39.0–52.0)
Hemoglobin: 7 g/dL — ABNORMAL LOW (ref 13.0–17.0)
MCH: 27.1 pg (ref 26.0–34.0)
MCHC: 32 g/dL (ref 30.0–36.0)
MCV: 84.9 fL (ref 78.0–100.0)
PLATELETS: 307 10*3/uL (ref 150–400)
RBC: 2.58 MIL/uL — ABNORMAL LOW (ref 4.22–5.81)
RDW: 16.9 % — AB (ref 11.5–15.5)
WBC: 11.6 10*3/uL — ABNORMAL HIGH (ref 4.0–10.5)

## 2017-05-03 LAB — HEMOGLOBIN AND HEMATOCRIT, BLOOD
HCT: 25.4 % — ABNORMAL LOW (ref 39.0–52.0)
HEMOGLOBIN: 8.1 g/dL — AB (ref 13.0–17.0)

## 2017-05-03 LAB — PREPARE RBC (CROSSMATCH)

## 2017-05-03 MED ORDER — PIPERACILLIN-TAZOBACTAM 3.375 G IVPB: 3.3750 g | Freq: Three times a day (TID) | INTRAVENOUS | 0 refills | Status: AC

## 2017-05-03 MED ORDER — PIPERACILLIN-TAZOBACTAM IV (FOR PTA / DISCHARGE USE ONLY): 3.3750 g | Freq: Four times a day (QID) | INTRAVENOUS | 0 refills | Status: AC

## 2017-05-03 MED ORDER — INSULIN DETEMIR 100 UNIT/ML ~~LOC~~ SOLN: 25.0000 [IU] | Freq: Every day | SUBCUTANEOUS | 11 refills | Status: AC

## 2017-05-03 MED ORDER — OXYCODONE HCL 20 MG PO TABS: 20.0000 mg | ORAL_TABLET | Freq: Four times a day (QID) | ORAL | 0 refills | Status: AC

## 2017-05-03 MED ORDER — HEPARIN SOD (PORK) LOCK FLUSH 100 UNIT/ML IV SOLN
500.0000 [IU] | INTRAVENOUS | Status: DC
  Filled 2017-05-03: qty 5

## 2017-05-03 MED ORDER — METOCLOPRAMIDE HCL 5 MG PO TABS: 5.0000 mg | ORAL_TABLET | Freq: Three times a day (TID) | ORAL | 0 refills | Status: AC

## 2017-05-03 MED ORDER — HEPARIN SOD (PORK) LOCK FLUSH 100 UNIT/ML IV SOLN
500.0000 [IU] | INTRAVENOUS | Status: DC | PRN
  Administered 2017-05-03: 500 [IU]
  Filled 2017-05-03: qty 5

## 2017-05-03 MED ORDER — SODIUM CHLORIDE 0.9 % IV SOLN
Freq: Once | INTRAVENOUS | Status: AC
  Administered 2017-05-03: 14:00:00 via INTRAVENOUS

## 2017-05-03 NOTE — Progress Notes (Signed)
Pt returning to [facility] [place]. [place] is temporary resident there, [place] from [place]. Report # [id number] room # [id number] All information provided to facility via the HUB.  Completed medical necessity form- [facility] transportation to be arranged once blood transfusion complete. (number for transport [phone] option 1, then option 3) Informed pt's godmother Wilber LionsLois. Agreeable to plan.   Ilean SkillMeghan Asmara Backs, MSW, LCSW Clinical Social Work 05/03/2017 [phone]

## 2017-05-03 NOTE — Progress Notes (Signed)
Pharmacy Antibiotic Note  [name] is a 36 y.o. male admitted on 04/25/2017 with Pseudomonas bacteremia.  Pharmacy has been consulted to change from Ceftazidime back to Zosyn dosing due to the development of possible altered mental status after previously changed from Zosyn to ceftazidime.  MD to check CT scan and recheck urine cultures.  10/20: AF, WBCs down, SCr improving.  Plan: Cont Zosyn 3.375g IV Q8H infused over 4hrs. Follow up renal fxn, culture results, and clinical course.  Height: 6' (182.9 cm) Weight: 214 lb 8.1 oz (97.3 kg) IBW/kg (Calculated) : 77.6  Temp (24hrs), Avg:98.7 F (37.1 C), Min:98.3 F (36.8 C), Max:99.1 F (37.3 C)   Recent Labs Lab 04/29/17 0338 04/29/17 0950 04/30/17 0450 05/01/17 0353 05/02/17 0446 05/03/17 0425  WBC 18.8*  --  17.4* 16.4* 11.7* 11.6*  CREATININE 1.49*  --  1.49* 1.38* 1.33* 1.29*  VANCOTROUGH  --  56*  --   --   --   --     Estimated Creatinine Clearance: 97.6 mL/min (A) (by C-G formula based on SCr of 1.29 mg/dL (H)).    Allergies  Allergen Reactions  . Lisinopril     Per MAR    Antimicrobials this admission:  Vancomycin 10/13 >> 10/16 Zosyn 10/13 >> 10/14, resumed Zosyn 10/17 >>  Cefepime 10/14 >> 10/15 Ceftazidime 10/15 >> 10/17  Dose adjustments this admission:  10/13: due to paraplegia, adjust vanco 1gm q8h to 1gm q12h 10/16 VT at 09:50 = 56 on 1g q12h ==> DC'd  Microbiology results:  10/13 BCx: Pseudomonas aeruginosa intermediate to cefepime, ceftaz (s) 10/13 UCx:  multiple species 10/13 MRSA PCR: positive 10/15 BCx (repeat): NGF  Thank you for allowing pharmacy to be a part of this patient's care.  [name], PharmD. Mobile: [phone]. 05/03/2017,12:28 PM.

## 2017-05-03 NOTE — Progress Notes (Signed)
PT Cancellation Note  Patient Details Name: [name] MRN: [id number] DOB: February 10, 1981   Cancelled Treatment:     Patient to get blood then return to SNF. Will not perform hydrotherapy today. Plan with dressing changes at SNF.  [place]ucket[place]aren Darcy Barbara PT [phone]  Rada HayHill, Saren Corkern Elizabeth 05/03/2017, 3:36 PM
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
## 2017-05-03 NOTE — Progress Notes (Signed)
luq colostomy bag and wafer changed. Pt tolerated with no problems.

## 2017-05-04 LAB — TYPE AND SCREEN
ABO/RH(D): A NEG
ANTIBODY SCREEN: NEGATIVE
UNIT DIVISION: 0

## 2017-05-04 LAB — BPAM RBC
Blood Product Expiration Date: 201811042359
ISSUE DATE / TIME: 201810201441
Unit Type and Rh: 600

## 2017-05-04 NOTE — Progress Notes (Signed)
Pt was discharged on 05/03/17 at about 1939 at shift change. Left unit on stretcher pushed by ambulance staff. Left in stable condition. Prior to leaving.report was called and given to Chanel, LPN at facility. All questions answered to nurses satisfaction. Clarification made regarding order for zosyn. Zosyn to be administered ever 8 hours instead of every 6 hours. Order details faxed to nurse after getting clarification from Dr. Sherryll BurgerShah. Derinda SisVera Kennerson,rn.

## 2017-05-22 ENCOUNTER — Encounter: Payer: Medicare Other | Attending: Surgery | Admitting: Surgery

## 2017-05-22 DIAGNOSIS — L89620 Pressure ulcer of left heel, unstageable: Secondary | ICD-10-CM | POA: Insufficient documentation

## 2017-05-22 DIAGNOSIS — G8221 Paraplegia, complete: Secondary | ICD-10-CM | POA: Insufficient documentation

## 2017-05-22 DIAGNOSIS — Z933 Colostomy status: Secondary | ICD-10-CM | POA: Insufficient documentation

## 2017-05-22 DIAGNOSIS — Z794 Long term (current) use of insulin: Secondary | ICD-10-CM | POA: Diagnosis not present

## 2017-05-22 DIAGNOSIS — Z7901 Long term (current) use of anticoagulants: Secondary | ICD-10-CM | POA: Insufficient documentation

## 2017-05-22 DIAGNOSIS — E11621 Type 2 diabetes mellitus with foot ulcer: Secondary | ICD-10-CM | POA: Diagnosis not present

## 2017-05-22 DIAGNOSIS — L89610 Pressure ulcer of right heel, unstageable: Secondary | ICD-10-CM | POA: Diagnosis not present

## 2017-05-22 DIAGNOSIS — Z79899 Other long term (current) drug therapy: Secondary | ICD-10-CM | POA: Diagnosis not present

## 2017-05-22 DIAGNOSIS — Z87891 Personal history of nicotine dependence: Secondary | ICD-10-CM | POA: Insufficient documentation

## 2017-05-22 DIAGNOSIS — E441 Mild protein-calorie malnutrition: Secondary | ICD-10-CM | POA: Diagnosis not present

## 2017-05-22 DIAGNOSIS — L8944 Pressure ulcer of contiguous site of back, buttock and hip, stage 4: Secondary | ICD-10-CM | POA: Diagnosis not present

## 2017-05-24 NOTE — Progress Notes (Addendum)
[name], [name] ([id number]) Visit Report for 05/22/2017 Allergy List Details Patient Name: [name], [name] Date of Service: 05/22/2017 1:15 PM Medical Record Number: [id number] Patient Account Number: [id number] Date of Birth/Sex: 06/13/1981 4(36 y.o. Male) Treating RN: Curtis Sitesorthy, Joanna Primary Care Romond Pipkins: PATIENT, NO Other Clinician: Referring Cataleya Cristina: Elodia FlorenceHAVIS, KENYON Treating Demitris Pokorny/Extender: Rudene ReBritto, Errol Weeks in Treatment: 0 Allergies Active Allergies lisinopril Allergy Notes Electronic Signature(s) Signed: 05/22/2017 5:11:26 PM By: Curtis Sitesorthy, Joanna Entered By: Curtis Sitesorthy, Joanna on 05/22/2017 14:13:58 [name], [name] ([id number]) -------------------------------------------------------------------------------- Arrival Information Details Patient Name: [name], [name] Date of Service: 05/22/2017 1:15 PM Medical Record Number: [id number] Patient Account Number: [id number] Date of Birth/Sex: 06/13/1981 [age] y.o. Male) Treating RN: Curtis Sitesorthy, Joanna Primary Care Manual Navarra: PATIENT, NO Other Clinician: Referring Kenzlie Disch: Elodia FlorenceHAVIS, KENYON Treating Dung Prien/Extender: Rudene ReBritto, Errol Weeks in Treatment: 0 Visit Information Patient Arrived: Stretcher Arrival Time: 13:20 Accompanied By: ems Transfer Assistance: Stretcher Patient Identification Verified: Yes Secondary Verification Process Completed: Yes Patient Has Alerts: Yes Patient Alerts: DMII Eliquis Electronic Signature(s) Signed: 05/22/2017 5:11:26 PM By: Curtis Sitesorthy, Joanna Entered By: Curtis Sitesorthy, Joanna on 05/22/2017 13:21:39 [name], [name] ([id number]) -------------------------------------------------------------------------------- Clinic Level of Care Assessment Details Patient Name: [name], [name] Date of Service: 05/22/2017 1:15 PM Medical Record Number: [id number] Patient Account Number: [id number] Date of Birth/Sex: 06/13/1981 38(36 y.o. Male) Treating RN: Curtis Sitesorthy, Joanna Primary Care Afia Messenger: PATIENT, NO Other Clinician: Referring  Babak Lucus: Elodia FlorenceHAVIS, KENYON Treating Raeley Gilmore/Extender: Rudene ReBritto, Errol Weeks in Treatment: 0 Clinic Level of Care Assessment Items TOOL 1 Quantity Score []  - Use when EandM and Procedure is performed on INITIAL visit 0 ASSESSMENTS - Nursing Assessment / Reassessment X - General Physical Exam (combine w/ comprehensive assessment (listed just below) when 1 20 performed on new pt. evals) X- 1 25 Comprehensive Assessment (HX, ROS, Risk Assessments, Wounds Hx, etc.) ASSESSMENTS - Wound and Skin Assessment / Reassessment []  - Dermatologic / Skin Assessment (not related to wound area) 0 ASSESSMENTS - Ostomy and/or Continence Assessment and Care []  - Incontinence Assessment and Management 0 []  - 0 Ostomy Care Assessment and Management (repouching, etc.) PROCESS - Coordination of Care X - Simple Patient / Family Education for ongoing care 1 15 []  - 0 Complex (extensive) Patient / Family Education for ongoing care X- 1 10 Staff obtains ChiropractorConsents, Records, Test Results / Process Orders []  - 0 Staff telephones HHA, Nursing Homes / Clarify orders / etc []  - 0 Routine Transfer to another Facility (non-emergent condition) []  - 0 Routine Hospital Admission (non-emergent condition) X- 1 15 New Admissions / Manufacturing engineernsurance Authorizations / Ordering NPWT, Apligraf, etc. []  - 0 Emergency Hospital Admission (emergent condition) PROCESS - Special Needs []  - Pediatric / Minor Patient Management 0 []  - 0 Isolation Patient Management []  - 0 Hearing / Language / Visual special needs []  - 0 Assessment of Community assistance (transportation, D/C planning, etc.) X- 1 15 Additional assistance / Altered mentation X- 1 15 Support Surface(s) Assessment (bed, cushion, seat, etc.) Mayford KnifeWILLIAMS, [name] ([id number]) INTERVENTIONS - Miscellaneous []  - External ear exam 0 []  - 0 Patient Transfer (multiple staff / Nurse, adultHoyer Lift / Similar devices) []  - 0 Simple Staple / Suture removal (25 or less) []  - 0 Complex Staple /  Suture removal (26 or more) []  - 0 Hypo/Hyperglycemic Management (do not check if billed separately) []  - 0 Ankle / Brachial Index (ABI) - do not check if billed separately Has the patient been seen at the hospital within the last three years: Yes Total Score: 115 Level Of Care: New/Established - Level 3 Electronic Signature(s) Signed: 05/22/2017 5:11:26  PM By: Curtis Sites Entered By: Curtis Sites on 05/22/2017 16:45:02 [name] ([id number]) -------------------------------------------------------------------------------- Encounter Discharge Information Details Patient Name: [name] Date of Service: 05/22/2017 1:15 PM Medical Record Number: [id number] Patient Account Number: [id number] Date of Birth/Sex: 08-08-1980 (36 y.o. Male) Treating RN: Curtis Sites Primary Care Rashonda Warrior: PATIENT, NO Other Clinician: Referring Tarika Mckethan: Elodia Florence Treating Cira Deyoe/Extender: Rudene Re in Treatment: 0 Encounter Discharge Information Items Discharge Pain Level: 0 Discharge Condition: Stable Ambulatory Status: Stretcher Discharge Destination: Nursing Home Transportation: Private Auto Accompanied By: ems Schedule Follow-up Appointment: No Medication Reconciliation completed and No provided to Patient/Care Jarion Hawthorne: Provided on Clinical Summary of Care: 05/22/2017 Form Type Recipient Paper Patient BW Electronic Signature(s) Signed: 05/22/2017 4:47:19 PM By: Curtis Sites Entered By: Curtis Sites on 05/22/2017 16:47:19 [name] ([id number]) -------------------------------------------------------------------------------- Lower Extremity Assessment Details Patient Name: [name] Date of Service: 05/22/2017 1:15 PM Medical Record Number: [id number] Patient Account Number: [id number] Date of Birth/Sex: 08-23-1980 (36 y.o. Male) Treating RN: Curtis Sites Primary Care Simcha Farrington: PATIENT, NO Other Clinician: Referring Marnae Madani: Elodia Florence Treating Adelaido Nicklaus/Extender: Rudene Re in Treatment: 0 Edema Assessment Assessed: [Left: No] [Right: No] Edema: [Left: Yes] [Right: Yes] Calf Left: Right: Point of Measurement: 36 cm From Medial Instep 38.7 cm 37.3 cm Ankle Left: Right: Point of Measurement: 12 cm From Medial Instep 24.6 cm 23.4 cm Vascular Assessment Claudication: Claudication Assessment [Left:None] [Right:None] Pulses: Dorsalis Pedis Palpable: [Left:Yes] [Right:Yes] Posterior Tibial Extremity colors, hair growth, and conditions: Extremity Color: [Left:Normal] [Right:Normal] Hair Growth on Extremity: [Left:Yes] [Right:Yes] Temperature of Extremity: [Left:Warm] [Right:Warm] Capillary Refill: [Left:< 3 seconds] [Right:< 3 seconds] Toe Nail Assessment Left: Right: Thick: Yes Yes Discolored: Yes Yes Deformed: No No Improper Length and Hygiene: No No Electronic Signature(s) Signed: 05/22/2017 5:11:26 PM By: Curtis Sites Entered By: Curtis Sites on 05/22/2017 14:13:50 [name] ([id number]) -------------------------------------------------------------------------------- Multi Wound Chart Details Patient Name: [name] Date of Service: 05/22/2017 1:15 PM Medical Record Number: [id number] Patient Account Number: [id number] Date of Birth/Sex: [birth year] (36 y.o. Male) Treating RN: Curtis Sites Primary Care Jazara Swiney: PATIENT, NO Other Clinician: Referring Gwendolyn Nishi: Elodia Florence Treating Neysha Criado/Extender: Rudene Re in Treatment: 0 Vital Signs Height(in): 72 Pulse(bpm): 101 Weight(lbs): 215 Blood Pressure(mmHg): 110/52 Body Mass Index(BMI): 29 Temperature(F): 98.4 Respiratory Rate 18 (breaths/min): Photos: [1:No Photos] [2:No Photos] [3:No Photos] Wound Location: [1:Abdomen - Lower Quadrant - Midline] [2:Right Foot - Plantar] [3:Left Lower Leg - Lateral] Wounding Event: [1:Surgical Injury] [2:Pressure Injury] [3:Pressure Injury] Primary Etiology: [1:Open  Surgical Wound] [2:Pressure Ulcer] [3:Open Surgical Wound] Secondary Etiology: [1:N/A] [2:Diabetic Wound/Ulcer of the Lower Extremity] [3:Diabetic Wound/Ulcer of the Lower Extremity] Comorbid History: [1:Anemia, Type II Diabetes, Paraplegia] [2:Anemia, Type II Diabetes, Paraplegia] [3:Anemia, Type II Diabetes, Paraplegia] Date Acquired: [1:01/20/2017] [2:03/22/2017] [3:03/22/2017] Weeks of Treatment: [1:0] [2:0] [3:0] Wound Status: [1:Open] [2:Open] [3:Open] Measurements L x W x D [1:1.8x1.6x0.1] [2:4.5x6.2x0.2] [3:7x2x0.1] (cm) Area (cm) : [1:2.262] [2:21.913] [3:10.996] Volume (cm) : [1:0.226] [2:4.383] [3:1.1] Undermining: [1:No] [2:No] [3:No] Classification: [1:Full Thickness Without Exposed Support Structures] [2:Unstageable/Unclassified] [3:Full Thickness Without Exposed Support Structures] Exudate Amount: [1:Large] [2:Large] [3:Medium] Exudate Type: [1:Serous] [2:Serosanguineous] [3:Serosanguineous] Exudate Color: [1:amber] [2:red, brown] [3:red, brown] Foul Odor After Cleansing: [1:No] [2:No] [3:No] Odor Anticipated Due to [1:N/A] [2:N/A] [3:N/A] Product Use: Wound Margin: [1:Flat and Intact] [2:Flat and Intact] [3:Flat and Intact] Granulation Amount: [1:Medium (34-66%)] [2:None Present (0%)] [3:Medium (34-66%)] Granulation Quality: [1:Pink] [2:N/A] [3:N/A] Necrotic Amount: [1:Medium (34-66%)] [2:Large (67-100%)] [3:Medium (34-66%)] Necrotic Tissue: [1:Adherent Slough] [2:Eschar, Adherent Slough] [3:Eschar, Adherent Slough] Exposed Structures: [1:Fascia: No  Fat Layer (Subcutaneous Tissue) Exposed: No Tendon: No Muscle: No Joint: No Bone: No] [2:Fascia: No Fat Layer (Subcutaneous Tissue) Exposed: No Tendon: No Muscle: No Joint: No Bone: No] [3:Fat Layer (Subcutaneous Tissue) Exposed: Yes  Fascia: No Tendon: No Muscle: No Joint: No Bone: No] Epithelialization: None N/A None Debridement: N/A Debridement ([id number]) N/A Pre-procedure N/A 14:29 N/A Verification/Time Out Taken: Pain  Control: N/A Lidocaine 4% Topical Solution N/A Tissue Debrided: N/A Necrotic/Eschar, N/A Fibrin/Slough, Subcutaneous Level: N/A Skin/Subcutaneous Tissue N/A Debridement Area (sq cm): N/A 27.9 N/A Instrument: N/A Forceps, Scissors N/A Bleeding: N/A Minimum N/A Hemostasis Achieved: N/A Pressure N/A Procedural Pain: N/A 0 N/A Post Procedural Pain: N/A 0 N/A Debridement Treatment N/A Procedure was tolerated well N/A Response: Post Debridement N/A 4.5x6.2x0.3 N/A Measurements L x W x D (cm) Post Debridement Volume: N/A 6.574 N/A (cm) Post Debridement Stage: N/A Unstageable/Unclassified N/A Periwound Skin Texture: Scarring: Yes Induration: Yes Excoriation: Yes Excoriation: No Excoriation: No Induration: No Induration: No Callus: No Callus: No Callus: No Crepitus: No Crepitus: No Crepitus: No Rash: No Rash: No Rash: No Scarring: No Scarring: No Periwound Skin Moisture: Maceration: No Maceration: Yes Maceration: No Dry/Scaly: No Dry/Scaly: Yes Dry/Scaly: No Periwound Skin Color: Atrophie Blanche: No Atrophie Blanche: No Erythema: Yes Cyanosis: No Cyanosis: No Atrophie Blanche: No Ecchymosis: No Ecchymosis: No Cyanosis: No Erythema: No Erythema: No Ecchymosis: No Hemosiderin Staining: No Hemosiderin Staining: No Hemosiderin Staining: No Mottled: No Mottled: No Mottled: No Pallor: No Pallor: No Pallor: No Rubor: No Rubor: No Rubor: No Temperature: No Abnormality N/A N/A Tenderness on Palpation: No No No Wound Preparation: Ulcer Cleansing: Ulcer Cleansing: Ulcer Cleansing: Rinsed/Irrigated with Saline Rinsed/Irrigated with Saline Rinsed/Irrigated with Saline Topical Anesthetic Applied: Topical Anesthetic Applied: Topical Anesthetic Applied: Other: lidocaine 4% Other: lidocaine 4% Other: lidocaine 4 % Procedures Performed: N/A Debridement N/A Wound Number: [id number]  Photos: No Photos No Photos No Photos Wound Location: Left Upper Leg - Posterior  Right Upper Leg - Posterior Right Sacrum Wounding Event: Pressure Injury Pressure Injury Pressure Injury 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 Secondary Etiology: Diabetic Wound/Ulcer of the Diabetic Wound/Ulcer of the N/A Lower Extremity Lower Extremity Comorbid History: Anemia, Type II Diabetes, Anemia, Type II Diabetes, Anemia, Type II Diabetes, Paraplegia Paraplegia Paraplegia Date Acquired: 03/22/2017 03/22/2017 03/22/2017 [name] ([id number]) Weeks of Treatment: 0 0 0 Wound Status: Open Open Open Measurements L x W x D 3.3x0.8x0.1 16.5x3.5x3 16.5x15.5x4.9 (cm) Area (cm) : 2.073 45.357 200.866 Volume (cm) : 0.207 136.07 984.241 Starting Position 1 9 (o'clock): Ending Position 1 3 (o'clock): Maximum Distance 1 (cm): 5.5 Undermining: No No Yes 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 Exudate Amount: Large Large Large Exudate Type: Serosanguineous Sanguinous Sanguinous Exudate Color: red, brown red red Foul Odor After Cleansing: No No Yes Odor Anticipated Due to N/A N/A No Product Use: Wound Margin: Flat and Intact Flat and Intact Flat and Intact Granulation Amount: Small (1-33%) Medium (34-66%) Small (1-33%) Granulation Quality: Pink Red Red Necrotic Amount: Large (67-100%) Medium (34-66%) Medium (34-66%) Necrotic Tissue: Eschar, Adherent Slough Eschar, Adherent Slough Eschar, Adherent Slough Exposed Structures: Fat Layer (Subcutaneous Fat Layer (Subcutaneous Fascia: Yes Tissue) Exposed: Yes Tissue) Exposed: Yes Fat Layer (Subcutaneous Fascia: No Muscle: Yes Tissue) Exposed: Yes Tendon: No Fascia: No Muscle: Yes Muscle: No Tendon: No Bone: Yes Joint: No Joint: No Tendon: No Bone: No Bone: No Joint: No Epithelialization: None Small (1-33%) None Debridement: N/A N/A Debridement ([id number]) Pre-procedure N/A N/A 14:34 Verification/Time Out Taken: Pain Control: N/A  N/A Lidocaine 4% Topical  Solution Tissue Debrided: N/A N/A Necrotic/Eschar, Fibrin/Slough, Subcutaneous Level: N/A N/A Skin/Subcutaneous Tissue Debridement Area (sq cm): N/A N/A 255.75 Instrument: N/A N/A Forceps, Scissors Bleeding: N/A N/A Minimum Hemostasis Achieved: N/A N/A Pressure Procedural Pain: N/A N/A 0 Post Procedural Pain: N/A N/A 0 Debridement Treatment N/A N/A Procedure was tolerated well Response: Post Debridement N/A N/A 16.5x15.5x5 Measurements L x W x D (cm) Post Debridement Volume: N/A N/A 1004.328 (cm) Post Debridement Stage: N/A N/A Category/Stage IV Periwound Skin Texture: Scarring: Yes Scarring: Yes Scarring: Yes Excoriation: No Excoriation: No Excoriation: No Induration: No Induration: No Induration: No Callus: No Callus: No Callus: No CHAMPOUX, Viola ([id number]) Crepitus: No Crepitus: No Crepitus: No Rash: No Rash: No Rash: No Periwound Skin Moisture: Maceration: No Maceration: No Maceration: Yes Dry/Scaly: No Dry/Scaly: No Dry/Scaly: No Periwound Skin Color: Atrophie Blanche: No Atrophie Blanche: No Erythema: Yes Cyanosis: No Cyanosis: No Atrophie Blanche: No Ecchymosis: No Ecchymosis: No Cyanosis: No Erythema: No Erythema: No Ecchymosis: No Hemosiderin Staining: No Hemosiderin Staining: No Hemosiderin Staining: No Mottled: No Mottled: No Mottled: No Pallor: No Pallor: No Pallor: No Rubor: No Rubor: No Rubor: No Temperature: N/A No Abnormality N/A Tenderness on Palpation: No No No Wound Preparation: Ulcer Cleansing: Ulcer Cleansing: Ulcer Cleansing: Rinsed/Irrigated with Saline Rinsed/Irrigated with Saline Rinsed/Irrigated with Saline Topical Anesthetic Applied: Topical Anesthetic Applied: Topical Anesthetic Applied: Other: lidocaine 4 % Other: lidocaine 4 % Other: lidocaine 4 % Procedures Performed: N/A N/A Debridement Wound Number: 7 N/A N/A Photos: No Photos N/A N/A Wound Location: Left Ischium N/A N/A Wounding Event: Pressure  Injury N/A N/A Primary Etiology: Pressure Ulcer N/A N/A Secondary Etiology: N/A N/A N/A Comorbid History: Anemia, Type II Diabetes, N/A N/A Paraplegia Date Acquired: 03/22/2017 N/A N/A Weeks of Treatment: 0 N/A N/A Wound Status: Open N/A N/A Measurements L x W x D 12.7x16x0.1 N/A N/A (cm) Area (cm) : 159.593 N/A N/A Volume (cm) : 15.959 N/A N/A Undermining: No N/A N/A Classification: Category/Stage II N/A N/A Exudate Amount: Large N/A N/A Exudate Type: Serous N/A N/A Exudate Color: amber N/A N/A Foul Odor After Cleansing: Yes N/A N/A Odor Anticipated Due to No N/A N/A Product Use: Wound Margin: Flat and Intact N/A N/A Granulation Amount: Small (1-33%) N/A N/A Granulation Quality: Pink N/A N/A Necrotic Amount: Large (67-100%) N/A N/A Necrotic Tissue: Eschar, Adherent Slough N/A N/A Exposed Structures: Fat Layer (Subcutaneous N/A N/A Tissue) Exposed: Yes Fascia: No Tendon: No Muscle: No Joint: No Bone: No Epithelialization: Small (1-33%) N/A N/A THOMA, PAULSEN ([id number]) Debridement: N/A N/A N/A Pain Control: N/A N/A N/A Tissue Debrided: N/A N/A N/A Level: N/A N/A N/A Debridement Area (sq cm): N/A N/A N/A Instrument: N/A N/A N/A Bleeding: N/A N/A N/A Hemostasis Achieved: N/A N/A N/A Procedural Pain: N/A N/A N/A Post Procedural Pain: N/A N/A N/A Debridement Treatment N/A N/A N/A Response: Post Debridement N/A N/A N/A Measurements L x W x D (cm) Post Debridement Volume: N/A N/A N/A (cm) Post Debridement Stage: N/A N/A N/A Periwound Skin Texture: Scarring: Yes N/A N/A Excoriation: No Induration: No Callus: No Crepitus: No Rash: No Periwound Skin Moisture: Maceration: No N/A N/A Dry/Scaly: No Periwound Skin Color: Erythema: Yes N/A N/A Atrophie Blanche: No Cyanosis: No Ecchymosis: No Hemosiderin Staining: No Mottled: No Pallor: No Rubor: No Temperature: N/A N/A N/A Tenderness on Palpation: No N/A N/A Wound Preparation: Ulcer Cleansing: N/A  N/A Rinsed/Irrigated with Saline Topical Anesthetic Applied: Other: lidocaine 4 % Procedures Performed: N/A N/A N/A Treatment Notes Electronic Signature(s) Signed: 05/22/2017  3:24:03 PM By: Evlyn Kanner MD, FACS Entered By: Evlyn Kanner on 05/22/2017 15:24:03 [name] ([id number]) -------------------------------------------------------------------------------- Multi-Disciplinary Care Plan Details Patient Name: [name] Date of Service: 05/22/2017 1:15 PM Medical Record Number: [id number] Patient Account Number: [id number] Date of Birth/Sex: 07-30-1980 (36 y.o. Male) Treating RN: Curtis Sites Primary Care Zaryah Seckel: PATIENT, NO Other Clinician: Referring Malley Hauter: Elodia Florence Treating Laraina Sulton/Extender: Rudene Re in Treatment: 0 Active Inactive Electronic Signature(s) Signed: 06/03/2017 2:46:14 PM By: Elliot Gurney, BSN, RN, CWS, Kim RN, BSN Signed: 06/16/2017 12:37:59 PM By: Curtis Sites Previous Signature: 05/22/2017 5:11:26 PM Version By: Curtis Sites Entered By: Elliot Gurney, BSN, RN, CWS, Kim on 06/03/2017 14:46:13 [name] ([id number]) -------------------------------------------------------------------------------- Pain Assessment Details Patient Name: [name] Date of Service: 05/22/2017 1:15 PM Medical Record Number: [id number] Patient Account Number: [id number] Date of Birth/Sex: [birth year] (36 y.o. Male) Treating RN: Curtis Sites Primary Care Shandricka Monroy: PATIENT, NO Other Clinician: Referring Shaquanta Harkless: Elodia Florence Treating Cecile Gillispie/Extender: Rudene Re in Treatment: 0 Active Problems Location of Pain Severity and Description of Pain Patient Has Paino No Site Locations Pain Management and Medication Current Pain Management: Electronic Signature(s) Signed: 05/22/2017 5:11:26 PM By: Curtis Sites Entered By: Curtis Sites on 05/22/2017 13:25:52 [name]  ([id number]) -------------------------------------------------------------------------------- Patient/Caregiver Education Details Patient Name: [name] Date of Service: 05/22/2017 1:15 PM Medical Record Number: [id number] Patient Account Number: [id number] Date of Birth/Gender: [birth year] (36 y.o. Male) Treating RN: Curtis Sites Primary Care Physician: PATIENT, NO Other Clinician: Referring Physician: Elodia Florence Treating Physician/Extender: Rudene Re in Treatment: 0 Education Assessment Education Provided To: Caregiver SNF nurses via written orders Education Topics Provided Wound/Skin Impairment: Handouts: Other: wound care as ordered Methods: Printed Electronic Signature(s) Signed: 05/22/2017 5:11:26 PM By: Curtis Sites Entered By: Curtis Sites on 05/22/2017 16:47:51 [name] ([id number]) -------------------------------------------------------------------------------- Wound Assessment Details Patient Name: [name] Date of Service: 05/22/2017 1:15 PM Medical Record Number: [id number] Patient Account Number: [id number] Date of Birth/Sex: August 28, 1980 (36 y.o. Male) Treating RN: Curtis Sites Primary Care Buell Parcel: PATIENT, NO Other Clinician: Referring Lummie Montijo: Elodia Florence Treating Jurni Cesaro/Extender: Rudene Re in Treatment: 0 Wound Status Wound Number: 1 Primary Etiology: Open Surgical Wound Wound Location: Abdomen - Lower Quadrant - Midline Wound Status: Open Wounding Event: Surgical Injury Comorbid History: Anemia, Type II Diabetes, Paraplegia Date Acquired: 01/20/2017 Weeks Of Treatment: 0 Clustered Wound: No Photos Photo Uploaded By: Curtis Sites on 05/23/2017 09:21:13 Wound Measurements Length: (cm) 1.8 Width: (cm) 1.6 Depth: (cm) 0.1 Area: (cm) 2.262 Volume: (cm) 0.226 % Reduction in Area: % Reduction in Volume: Epithelialization: None Tunneling: No Undermining: No Wound Description Full Thickness  Without Exposed Support Classification: Structures Wound Margin: Flat and Intact Exudate Large Amount: Exudate Type: Serous Exudate Color: amber Foul Odor After Cleansing: No Slough/Fibrino Yes Wound Bed Granulation Amount: Medium (34-66%) Exposed Structure Granulation Quality: Pink Fascia Exposed: No Necrotic Amount: Medium (34-66%) Fat Layer (Subcutaneous Tissue) Exposed: No Necrotic Quality: Adherent Slough Tendon Exposed: No Muscle Exposed: No Joint Exposed: No Bone Exposed: No Galvao, Lonnie ([id number]) Periwound Skin Texture Texture Color No Abnormalities Noted: No No Abnormalities Noted: No Callus: No Atrophie Blanche: No Crepitus: No Cyanosis: No Excoriation: No Ecchymosis: No Induration: No Erythema: No Rash: No Hemosiderin Staining: No Scarring: Yes Mottled: No Pallor: No Moisture Rubor: No No Abnormalities Noted: No Dry / Scaly: No Temperature / Pain Maceration: No Temperature: No Abnormality Wound Preparation Ulcer Cleansing: Rinsed/Irrigated with Saline Topical Anesthetic Applied: Other: lidocaine 4%, Electronic Signature(s) Signed: 05/22/2017 5:11:26 PM By: Curtis Sites Entered By: Curtis Sites on  05/22/2017 13:40:01 BARY, LIMBACH ([id number]) -------------------------------------------------------------------------------- Wound Assessment Details Patient Name: SHAWON, DENZER Date of Service: 05/22/2017 1:15 PM Medical Record Number: [id number] Patient Account Number: [id number] Date of Birth/Sex: 05-Aug-1980 (36 y.o. Male) Treating RN: Curtis Sites Primary Care Estella Malatesta: PATIENT, NO Other Clinician: Referring Deatrice Spanbauer: Elodia Florence Treating Raeanna Soberanes/Extender: Rudene Re in Treatment: 0 Wound Status Wound Number: 2 Primary Etiology: Pressure Ulcer Wound Location: Right Foot - Plantar Secondary Diabetic Wound/Ulcer of the Lower Etiology: Extremity Wounding Event: Pressure Injury Wound Status: Open Date Acquired:  03/22/2017 Comorbid History: Anemia, Type II Diabetes, Paraplegia Weeks Of Treatment: 0 Clustered Wound: No Photos Photo Uploaded By: Curtis Sites on 05/23/2017 09:21:56 Wound Measurements Length: (cm) 4.5 % Redu Width: (cm) 6.2 % Redu Depth: (cm) 0.2 Tunnel Area: (cm) 21.913 Under Volume: (cm) 4.383 ction in Area: ction in Volume: ing: No mining: No Wound Description Classification: Unstageable/Unclassified Wound Margin: Flat and Intact Exudate Amount: Large Exudate Type: Serosanguineous Exudate Color: red, brown Foul Odor After Cleansing: No Slough/Fibrino No Wound Bed Granulation Amount: None Present (0%) Exposed Structure Necrotic Amount: Large (67-100%) Fascia Exposed: No Necrotic Quality: Eschar, Adherent Slough Fat Layer (Subcutaneous Tissue) Exposed: No Tendon Exposed: No Muscle Exposed: No Joint Exposed: No Bone Exposed: No Periwound Skin Texture Leavelle, Zigmond ([id number]) Texture Color No Abnormalities Noted: No No Abnormalities Noted: No Callus: No Atrophie Blanche: No Crepitus: No Cyanosis: No Excoriation: No Ecchymosis: No Induration: Yes Erythema: No Rash: No Hemosiderin Staining: No Scarring: No Mottled: No Pallor: No Moisture Rubor: No No Abnormalities Noted: No Dry / Scaly: Yes Maceration: Yes Wound Preparation Ulcer Cleansing: Rinsed/Irrigated with Saline Topical Anesthetic Applied: Other: lidocaine 4%, Electronic Signature(s) Signed: 05/22/2017 5:11:26 PM By: Curtis Sites Entered By: Curtis Sites on 05/22/2017 13:44:48 [name] ([id number]) -------------------------------------------------------------------------------- Wound Assessment Details Patient Name: [name] Date of Service: 05/22/2017 1:15 PM Medical Record Number: [id number] Patient Account Number: [id number] Date of Birth/Sex: 02-09-1981 (36 y.o. Male) Treating RN: Curtis Sites Primary Care Arah Aro: PATIENT, NO Other Clinician: Referring  Ova Gillentine: Elodia Florence Treating Charlii Yost/Extender: Rudene Re in Treatment: 0 Wound Status Wound Number: 3 Primary Etiology: Open Surgical Wound Wound Location: Left Lower Leg - Lateral Secondary Diabetic Wound/Ulcer of the Lower Etiology: Extremity Wounding Event: Pressure Injury Wound Status: Open Date Acquired: 03/22/2017 Comorbid History: Anemia, Type II Diabetes, Paraplegia Weeks Of Treatment: 0 Clustered Wound: No Photos Photo Uploaded By: Curtis Sites on 05/23/2017 09:21:57 Wound Measurements Length: (cm) 7 Width: (cm) 2 Depth: (cm) 0.1 Area: (cm) 10.996 Volume: (cm) 1.1 % Reduction in Area: % Reduction in Volume: Epithelialization: None Tunneling: No Undermining: No Wound Description Full Thickness Without Exposed Support Classification: Structures Wound Margin: Flat and Intact Exudate Medium Amount: Exudate Type: Serosanguineous Exudate Color: red, brown Foul Odor After Cleansing: No Slough/Fibrino No Wound Bed Granulation Amount: Medium (34-66%) Exposed Structure Necrotic Amount: Medium (34-66%) Fascia Exposed: No Necrotic Quality: Eschar, Adherent Slough Fat Layer (Subcutaneous Tissue) Exposed: Yes Tendon Exposed: No Muscle Exposed: No Joint Exposed: No Bone Exposed: No Ficek, Brigham ([id number]) Periwound Skin Texture Texture Color No Abnormalities Noted: No No Abnormalities Noted: No Callus: No Atrophie Blanche: No Crepitus: No Cyanosis: No Excoriation: Yes Ecchymosis: No Induration: No Erythema: Yes Rash: No Hemosiderin Staining: No Scarring: No Mottled: No Pallor: No Moisture Rubor: No No Abnormalities Noted: No Dry / Scaly: No Maceration: No Wound Preparation Ulcer Cleansing: Rinsed/Irrigated with Saline Topical Anesthetic Applied: Other: lidocaine 4 %, Electronic Signature(s) Signed: 05/22/2017 5:11:26 PM By: Curtis Sites Entered By: Curtis Sites on 05/22/2017 13:53:14 Mayford Knife,  Jadarius  ([id number]) -------------------------------------------------------------------------------- Wound Assessment Details Patient Name: JAYDIN, BONIFACE Date of Service: 05/22/2017 1:15 PM Medical Record Number: [id number] Patient Account Number: [id number] Date of Birth/Sex: August 11, 1980 (36 y.o. 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 Event: Pressure Injury Wound Status: Open Date Acquired: 03/22/2017 Comorbid History: Anemia, Type II Diabetes, Paraplegia Weeks Of Treatment: 0 Clustered Wound: No Photos Photo Uploaded By: Curtis Sites on 05/23/2017 09:22:54 Wound Measurements Length: (cm) 3.3 Width: (cm) 0.8 Depth: (cm) 0.1 Area: (cm) 2.073 Volume: (cm) 0.207 % Reduction in Area: % Reduction in Volume: Epithelialization: None Tunneling: No Undermining: No Wound Description Classification: Category/Stage III Wound Margin: Flat and Intact Exudate Amount: Large Exudate Type: Serosanguineous Exudate Color: red, brown Foul Odor After Cleansing: No Slough/Fibrino No Wound Bed Granulation Amount: Small (1-33%) Exposed Structure Granulation Quality: Pink Fascia Exposed: No Necrotic Amount: Large (67-100%) Fat Layer (Subcutaneous Tissue) Exposed: Yes Necrotic Quality: Eschar, Adherent Slough Tendon Exposed: No Muscle Exposed: No Joint Exposed: No Bone Exposed: No Periwound Skin Texture Josephson, Arlester ([id number]) Texture Color No Abnormalities Noted: No No Abnormalities Noted: No Callus: No Atrophie Blanche: No Crepitus: No Cyanosis: No Excoriation: No Ecchymosis: No Induration: No Erythema: No Rash: No Hemosiderin Staining: No Scarring:  Yes Mottled: No Pallor: No Moisture Rubor: No No Abnormalities Noted: No Dry / Scaly: No Maceration: No Wound Preparation Ulcer Cleansing: Rinsed/Irrigated with Saline Topical Anesthetic Applied: Other: lidocaine 4 %, Electronic Signature(s) Signed: 05/22/2017 5:11:26 PM By: Curtis Sites Entered By: Curtis Sites on 05/22/2017 13:57:10 [name] ([id number]) -------------------------------------------------------------------------------- Wound Assessment Details Patient Name: [name] Date of Service: 05/22/2017 1:15 PM Medical Record Number: [id number] Patient Account Number: [id number] Date of Birth/Sex: 02/05/1981 (36 y.o. Male) Treating RN: Curtis Sites Primary Care Boyd Litaker: PATIENT, NO Other Clinician: Referring Norville Dani: Elodia Florence Treating Rayelle Armor/Extender: Rudene Re in Treatment: 0 Wound Status Wound Number: 5 Primary Etiology: Pressure Ulcer Wound Location: Right Upper Leg - Posterior Secondary Diabetic Wound/Ulcer of the Lower Etiology: Extremity Wounding Event: Pressure Injury Wound Status: Open Date Acquired: 03/22/2017 Comorbid History: Anemia, Type II Diabetes, Paraplegia Weeks Of Treatment: 0 Clustered Wound: No Photos Photo Uploaded By: Curtis Sites on 05/23/2017 09:22:56 Wound Measurements Length: (cm) 16.5 Width: (cm) 3.5 Depth: (cm) 3 Area: (cm) 45.357 Volume: (cm) 136.07 % Reduction in Area: % Reduction in Volume: Epithelialization: Small (1-33%) Tunneling: No Undermining: No Wound Description Classification: Category/Stage IV Foul O Wound Margin: Flat and Intact Slough Exudate Amount: Large Exudate Type: Sanguinous Exudate Color: red dor After Cleansing: No /Fibrino Yes Wound Bed Granulation Amount: Medium (34-66%) Exposed Structure Granulation Quality: Red Fascia Exposed: No Necrotic Amount: Medium (34-66%) Fat Layer (Subcutaneous Tissue) Exposed: Yes Necrotic Quality: Eschar, Adherent  Slough Tendon Exposed: No Muscle Exposed: Yes Necrosis of Muscle: No Joint Exposed: No Bone Exposed: No Periwound Skin Texture Likes, Spenser ([id number]) Texture Color No Abnormalities Noted: No No Abnormalities Noted: No Callus: No Atrophie Blanche: No Crepitus: No Cyanosis: No Excoriation: No Ecchymosis: No Induration: No Erythema: No Rash: No Hemosiderin Staining: No Scarring: Yes Mottled: No Pallor: No Moisture Rubor: No No Abnormalities Noted: No Dry / Scaly: No Temperature / Pain Maceration: No Temperature: No Abnormality Wound Preparation Ulcer Cleansing: Rinsed/Irrigated with Saline Topical Anesthetic Applied: Other: lidocaine 4 %, Electronic Signature(s) Signed: 05/22/2017 5:11:26 PM  By: Curtis Sitesorthy, Joanna Entered By: Curtis Sitesorthy, Joanna on 05/22/2017 14:00:20 [name], Brad ([id number]) -------------------------------------------------------------------------------- Wound Assessment Details Patient Name: [name], Karmello Date of Service: 05/22/2017 1:15 PM Medical Record Number: [id number] Patient Account Number: [id number] Date of Birth/Sex: 07-01-1981 [age] y.o. Male) Treating RN: Curtis Sitesorthy, Joanna Primary Care Calley Drenning: PATIENT, NO Other Clinician: Referring Tommy Minichiello: Elodia FlorenceHAVIS, KENYON Treating Jiles Goya/Extender: Rudene ReBritto, Errol Weeks in Treatment: 0 Wound Status Wound Number: 6 Primary Etiology: Pressure Ulcer Wound Location: Right Sacrum Wound Status: Open Wounding Event: Pressure Injury Comorbid History: Anemia, Type II Diabetes, Paraplegia Date Acquired: 03/22/2017 Weeks Of Treatment: 0 Clustered Wound: No Photos Photo Uploaded By: Curtis Sitesorthy, Joanna on 05/23/2017 09:23:34 Wound Measurements Length: (cm) 16.5 Width: (cm) 15.5 Depth: (cm) 4.9 Area: (cm) 200.866 Volume: (cm) 984.241 % Reduction in Area: % Reduction in Volume: Epithelialization: None Undermining: Yes Starting Position (o'clock): 9 Ending Position (o'clock): 3 Maximum Distance: (cm)  5.5 Wound Description Classification: Category/Stage IV Wound Margin: Flat and Intact Exudate Amount: Large Exudate Type: Sanguinous Exudate Color: red Foul Odor After Cleansing: Yes Due to Product Use: No Slough/Fibrino Yes Wound Bed Granulation Amount: Small (1-33%) Exposed Structure Granulation Quality: Red Fascia Exposed: Yes Necrotic Amount: Medium (34-66%) Fat Layer (Subcutaneous Tissue) Exposed: Yes Necrotic Quality: Eschar, Adherent Slough Tendon Exposed: No Muscle Exposed: Yes Necrosis of Muscle: Yes [name], Edoardo ([id number]) Joint Exposed: No Bone Exposed: Yes Periwound Skin Texture Texture Color No Abnormalities Noted: No No Abnormalities Noted: No Callus: No Atrophie Blanche: No Crepitus: No Cyanosis: No Excoriation: No Ecchymosis: No Induration: No Erythema: Yes Rash: No Hemosiderin Staining: No Scarring: Yes Mottled: No Pallor: No Moisture Rubor: No No Abnormalities Noted: No Dry / Scaly: No Maceration: Yes Wound Preparation Ulcer Cleansing: Rinsed/Irrigated with Saline Topical Anesthetic Applied: Other: lidocaine 4 %, Electronic Signature(s) Signed: 05/22/2017 5:11:26 PM By: Curtis Sitesorthy, Joanna Entered By: Curtis Sitesorthy, Joanna on 05/22/2017 14:05:48 [name], Deshon ([id number]) -------------------------------------------------------------------------------- Wound Assessment Details Patient Name: [name], Timotheus Date of Service: 05/22/2017 1:15 PM Medical Record Number: [id number] Patient Account Number: [id number] Date of Birth/Sex: 07-01-1981 [age] y.o. Male) Treating RN: Curtis Sitesorthy, Joanna Primary Care Mylah Baynes: PATIENT, NO Other Clinician: Referring Carrin Vannostrand: Elodia FlorenceHAVIS, KENYON Treating Doyal Saric/Extender: Rudene ReBritto, Errol Weeks in Treatment: 0 Wound Status Wound Number: 7 Primary Etiology: Pressure Ulcer Wound Location: Left Ischium Wound Status: Open Wounding Event: Pressure Injury Comorbid History: Anemia, Type II Diabetes, Paraplegia Date Acquired:  03/22/2017 Weeks Of Treatment: 0 Clustered Wound: No Wound Measurements Length: (cm) 12.7 Width: (cm) 16 Depth: (cm) 0.1 Area: (cm) 159.593 Volume: (cm) 15.959 % Reduction in Area: % Reduction in Volume: Epithelialization: Small (1-33%) Tunneling: No Undermining: No Wound Description Classification: Category/Stage II Wound Margin: Flat and Intact Exudate Amount: Large Exudate Type: Serous Exudate Color: amber Foul Odor After Cleansing: Yes Due to Product Use: No Slough/Fibrino Yes Wound Bed Granulation Amount: Small (1-33%) Exposed Structure Granulation Quality: Pink Fascia Exposed: No Necrotic Amount: Large (67-100%) Fat Layer (Subcutaneous Tissue) Exposed: Yes Necrotic Quality: Eschar, Adherent Slough Tendon Exposed: No Muscle Exposed: No Joint Exposed: No Bone Exposed: No Periwound Skin Texture Texture Color No Abnormalities Noted: No No Abnormalities Noted: No Callus: No Atrophie Blanche: No Crepitus: No Cyanosis: No Excoriation: No Ecchymosis: No Induration: No Erythema: Yes Rash: No Hemosiderin Staining: No Scarring: Yes Mottled: No Pallor: No Moisture Rubor: No No Abnormalities Noted: No Dry / Scaly: No Maceration: No [name], Markevious ([id number]) Wound Preparation Ulcer Cleansing: Rinsed/Irrigated with Saline Topical Anesthetic Applied: Other: lidocaine 4 %, Electronic Signature(s) Signed: 05/22/2017 5:11:26 PM By: Curtis Sitesorthy, Joanna Entered By: Curtis Sitesorthy, Joanna on 05/22/2017 14:08:48  JARTAVIOUS, MCKIMMY ([id number]) -------------------------------------------------------------------------------- Vitals Details Patient Name: MANI, CELESTIN Date of Service: 05/22/2017 1:15 PM Medical Record Number: [id number] Patient Account Number: [id number] Date of Birth/Sex: 03-14-1981 (37 y.o. Male) Treating RN: Curtis Sites Primary Care Adalei Novell: PATIENT, NO Other Clinician: Referring Brizeida Mcmurry: Elodia Florence Treating Tyreshia Ingman/Extender: Rudene Re in Treatment: 0 Vital Signs Time Taken: 13:27 Temperature (F): 98.4 Height (in): 72 Pulse (bpm): 101 Source: Stated Respiratory Rate (breaths/min): 18 Weight (lbs): 215 Blood Pressure (mmHg): 110/52 Source: Stated Reference Range: 80 - 120 mg / dl Body Mass Index (BMI): 29.2 Electronic Signature(s) Signed: 05/22/2017 5:11:26 PM By: Curtis Sites Entered By: Curtis Sites on 05/22/2017 13:28:29

## 2017-05-24 NOTE — Progress Notes (Signed)
[name], [name] ([id number]) Visit Report for 05/22/2017 Abuse/Suicide Risk Screen Details Patient Name: [name], [name] Date of Service: 05/22/2017 1:15 PM Medical Record Number: [id number] Patient Account Number: [id number] Date of Birth/Sex: 1981-04-13 [age] y.o. Male) Treating RN: [name] Primary Care [name]: PATIENT, NO Other Clinician: Referring [name]: [name] Treating [name]/Extender: [name] Abuse/Suicide Risk Screen Items Answer ABUSE/SUICIDE RISK SCREEN: Has anyone close to you tried to hurt or harm you recentlyo No Do you feel uncomfortable with anyone in your familyo No Has anyone forced you do things that you didnot want to doo No Do you have any thoughts of harming yourselfo No Patient displays signs or symptoms of abuse and/or neglect. No Electronic Signature(s) Signed: 05/22/2017 5:11:26 PM By: [name] Entered By: [name] on 05/22/2017 13:37:24 [name], [name] ([id number]) -------------------------------------------------------------------------------- Activities of Daily Living Details Patient Name: [name], [name] Date of Service: 05/22/2017 1:15 PM Medical Record Number: [id number] Patient Account Number: [id number] Date of Birth/Sex: 1981-04-13 [age] y.o. Male) Treating RN: [name] Primary Care [name]: PATIENT, NO Other Clinician: Referring [name]: [name] Treating [name]/Extender: [name] Activities of Daily Living Items Answer Activities of Daily Living (Please select one for each item) Drive Automobile Not Able Take Medications Completely Able Use Telephone Completely Able Care for Appearance Need Assistance Use Toilet Need Assistance Bath / Shower Need Assistance Dress Self Need Assistance Feed Self Completely Able Walk Not Able Get In / Out Bed Need Assistance Housework Need Assistance Prepare Meals Need Assistance Handle Money Completely  Able Shop for Self Not Able Electronic Signature(s) Signed: 05/22/2017 5:11:26 PM By: [name] Entered By: [name] on 05/22/2017 13:37:35 [name], [name] ([id number]) -------------------------------------------------------------------------------- Education Assessment Details Patient Name: [name], [name] Date of Service: 05/22/2017 1:15 PM Medical Record Number: [id number] Patient Account Number: [id number] Date of Birth/Sex: 1981-04-13 [age] y.o. Male) Treating RN: [name] Primary Care [name]: PATIENT, NO Other Clinician: Referring [name]: [name] Treating [name]/Extender: [name] Primary Learner Assessed: Caregiver SNF nurses via written orders Learning Preferences/Education Level/Primary Language Learning Preference: Printed Material Highest Education Level: College or Above Preferred Language: English Cognitive Barrier Assessment/Beliefs Language Barrier: No Translator Needed: No Memory Deficit: No Emotional Barrier: No Cultural/Religious Beliefs Affecting Medical Care: No Physical Barrier Assessment Impaired Vision: No Impaired Hearing: No Decreased Hand dexterity: No Knowledge/Comprehension Assessment Knowledge Level: Medium Comprehension Level: Medium Ability to understand written Medium instructions: Ability to understand verbal Medium instructions: Motivation Assessment Anxiety Level: Calm Cooperation: Cooperative Education Importance: Acknowledges Need Interest in Health Problems: Asks Questions Perception: Coherent Willingness to Engage in Self- Medium Management Activities: Readiness to Engage in Self- Medium Management Activities: Electronic Signature(s) Signed: 05/22/2017 5:11:26 PM By: [name] Entered By: [name] on 05/22/2017 13:37:41 [name], [name] ([id number]) -------------------------------------------------------------------------------- Fall Risk Assessment  Details Patient Name: [name], [name] Date of Service: 05/22/2017 1:15 PM Medical Record Number: [id number] Patient Account Number: [id number] Date of Birth/Sex: 1981-04-13 [age] y.o. Male) Treating RN: [name] Primary Care [name]: PATIENT, NO Other Clinician: Referring [name]: [name] Treating [name]/Extender: [name] Fall Risk Assessment Items Have you had 2 or more falls in the last 12 monthso Edwards No Have you had any fall that resulted in injury in the last 12 monthso Edwards No FALL RISK ASSESSMENT: History of falling - immediate or within 3 months Edwards No Secondary diagnosis Edwards No Ambulatory aid None/bed rest/wheelchair/nurse Edwards Yes Crutches/cane/walker Edwards No Furniture Edwards No IV Access/Saline Lock Edwards No  Gait/Training Normal/bed rest/immobile Edwards Yes Weak Edwards No Impaired Edwards No Mental Status Oriented to own ability Edwards Yes Electronic Signature(s) Signed: 05/22/2017 5:11:26 PM By: [name] Entered By: [name] on 05/22/2017 13:37:54 [name], [name] ([id number]) -------------------------------------------------------------------------------- Foot Assessment Details Patient Name: [name], [name] Date of Service: 05/22/2017 1:15 PM Medical Record Number: [id number] Patient Account Number: [id number] Date of Birth/Sex: Aug 17, 1980 [age] y.o. Male) Treating RN: [name] Primary Care [name]: PATIENT, NO Other Clinician: Referring [name]: [name] Treating [name]/Extender: [name] Foot Assessment Items Site Locations + = Sensation present, - = Sensation absent, C = Callus, U = Ulcer R = Redness, W = Warmth, M = Maceration, PU = Pre-ulcerative lesion F = Fissure, S = Swelling, D = Dryness Assessment Right: Left: Other Deformity: No No Prior Foot Ulcer: No No Prior Amputation: No No Charcot Joint: No No Ambulatory Status: Non-ambulatory Gait: Electronic Signature(s) Signed: 05/22/2017 5:11:26 PM  By: [name] Entered By: [name] on 05/22/2017 13:38:20 [name], Ashtian ([id number]) -------------------------------------------------------------------------------- Nutrition Risk Assessment Details Patient Name: [name], Haruto Date of Service: 05/22/2017 1:15 PM Medical Record Number: [id number] Patient Account Number: [id number] Date of Birth/Sex: Aug 17, 1980 [age] y.o. Male) Treating RN: [name] Primary Care Rylann Munford: PATIENT, NO Other Clinician: Referring Joven Mom: [name] Treating Anayah Arvanitis/Extender: [name] Height (in): Weight (lbs): Body Mass Index (BMI): Nutrition Risk Assessment Items NUTRITION RISK SCREEN: I have an illness or condition that made me change the kind and/or amount of Edwards No food I eat I eat fewer than two meals per day Edwards No I eat few fruits and vegetables, or milk products Edwards No I have three or more drinks of beer, liquor or wine almost every day Edwards No I have tooth or mouth problems that make it hard for me to eat Edwards No I don't always have enough money to buy the food I need Edwards No I eat alone most of the time Edwards No I take three or more different prescribed or over-the-counter drugs a day 1 Yes Without wanting to, I have lost or gained 10 pounds in the last six months Edwards No I am not always physically able to shop, cook and/or feed myself Edwards No Nutrition Protocols Good Risk Protocol Edwards No interventions needed Moderate Risk Protocol Electronic Signature(s) Signed: 05/22/2017 5:11:26 PM By: [name] Entered By: [name] on 05/22/2017 13:22:21

## 2017-05-24 NOTE — Progress Notes (Signed)
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 Kitchen Psychiatric Judgement and insight Intact.. No evidence of depression, anxiety, or agitation.. 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 He's also got ulcers on both gluteal areas and upper thighs and also on his left lateral leg and both heels. Sharp debridement is done in various places on his right heel and the right lateral part of his sacral area Integumentary (Hair, Skin) No suspicious lesions. No crepitus or fluctuance. No peri-wound warmth or erythema. No masses.. Wound #1 status is Open. Original cause of wound was Surgical Injury. The wound is located on the Midline Abdomen - Lower Quadrant. The wound measures 1.8cm length x 1.6cm width x 0.1cm depth;  2.262cm^2 area and 0.226cm^3 volume. There is no tunneling or undermining noted. There is a large amount of serous drainage noted. The wound margin is flat and intact. There is medium (34-66%) pink granulation within the wound bed. There is a medium (34-66%) amount of necrotic CARLIN, ATTRIDGE ([id number]) tissue within the wound bed including Adherent Slough. The periwound skin appearance exhibited: Scarring. The periwound skin appearance did not exhibit: Callus, Crepitus, Excoriation, Induration, Rash, Dry/Scaly, Maceration, Atrophie Blanche, Cyanosis, Ecchymosis, Hemosiderin Staining, Mottled, Pallor, Rubor, Erythema. Periwound temperature was noted as No Abnormality. Wound #2 status is Open. Original cause of wound was Pressure Injury. The wound is located on the Right,Plantar Foot. The wound measures 4.5cm length x 6.2cm width x 0.2cm depth; 21.913cm^2 area and 4.383cm^3 volume. There is no tunneling or undermining noted. There is a large amount of serosanguineous drainage noted. The wound margin is flat and intact. There is no granulation within the wound bed. There is a large (67-100%) amount of necrotic tissue within the wound bed including Eschar and Adherent Slough. The periwound skin appearance exhibited: Induration, Dry/Scaly, Maceration. The periwound skin appearance did not exhibit: Callus, Crepitus, Excoriation, Rash, Scarring, Atrophie Blanche, Cyanosis, Ecchymosis, Hemosiderin Staining, Mottled, Pallor, Rubor, Erythema. Wound #3 status is Open. Original cause of wound was Pressure Injury. The wound is located on the Left,Lateral Lower Leg. The wound measures 7cm length x 2cm width x 0.1cm depth; 10.996cm^2 area and 1.1cm^3 volume. There is Fat Layer (Subcutaneous Tissue) Exposed exposed. There is no tunneling or undermining noted. There is a medium amount of serosanguineous drainage noted. The wound margin is flat and intact. There is medium (34-66%) granulation within the wound  bed. There is a medium (34-66%) amount of necrotic tissue within the wound bed including Eschar and Adherent Slough. The periwound skin appearance exhibited: Excoriation, Erythema. The periwound skin appearance did not exhibit: Callus, Crepitus, Induration, Rash, Scarring, Dry/Scaly, Maceration, Atrophie Blanche, Cyanosis, Ecchymosis, Hemosiderin Staining, Mottled, Pallor, Rubor. The surrounding wound skin color is noted with erythema. Wound #4 status is Open. Original cause of wound was Pressure Injury. The wound is located on the Left,Posterior Upper Leg. The wound measures 3.3cm length x 0.8cm width x 0.1cm depth; 2.073cm^2 area and 0.207cm^3 volume. There is Fat Layer (Subcutaneous Tissue) Exposed exposed. There is no tunneling or undermining noted. There is a large amount of serosanguineous drainage noted. The wound margin is flat and intact. There is small (1-33%) pink granulation within the wound bed. There is a large (67-100%) amount of necrotic tissue within the wound bed including Eschar and Adherent Slough. The periwound skin appearance exhibited: Scarring. The periwound skin appearance did not exhibit: Callus, Crepitus, Excoriation, Induration, Rash, Dry/Scaly, Maceration, Atrophie Blanche, Cyanosis, Ecchymosis, Hemosiderin Staining, Mottled, Pallor, Rubor, Erythema. Wound #5 status is Open. Original cause of wound was Pressure Injury. The wound is located on the Right,Posterior Upper Leg. The wound measures  16.5cm length x 3.5cm width x 3cm depth; 45.357cm^2 area and 136.07cm^3 volume. There is muscle and Fat Layer (Subcutaneous Tissue) Exposed exposed. There is no tunneling or undermining noted. There is a large amount of sanguinous drainage noted. The wound margin is flat and intact. There is medium (34-66%) red granulation within the wound bed. There is a medium (34-66%) amount of necrotic tissue within the wound bed including Eschar and Adherent Slough. The periwound skin appearance  exhibited: Scarring. The periwound skin appearance did not exhibit: Callus, Crepitus, Excoriation, Induration, Rash, Dry/Scaly, Maceration, Atrophie Blanche, Cyanosis, Ecchymosis, Hemosiderin Staining, Mottled, Pallor, Rubor, Erythema. Periwound temperature was noted as No Abnormality. Wound #6 status is Open. Original cause of wound was Pressure Injury. The wound is located on the Right Sacrum. The wound measures 16.5cm length x 15.5cm width x 4.9cm depth; 200.866cm^2 area and 984.241cm^3 volume. There is bone, muscle, Fat Layer (Subcutaneous Tissue) Exposed, and fascia exposed. There is undermining starting at 9:00 and ending at 3:00 with a maximum distance of 5.5cm. There is a large amount of sanguinous drainage noted. Foul odor after cleansing was noted. The wound margin is flat and intact. There is small (1-33%) red granulation within the wound bed. There is a medium (34-66%) amount of necrotic tissue within the wound bed including Eschar, Adherent Slough and Necrosis of Muscle. The periwound skin appearance exhibited: Scarring, Maceration, Erythema. The periwound skin appearance did not exhibit: Callus, Crepitus, Excoriation, Induration, Rash, Dry/Scaly, Atrophie Blanche, Cyanosis, Ecchymosis, Hemosiderin Staining, Mottled, Pallor, Rubor. The surrounding wound skin color is noted with erythema. Wound #7 status is Open. Original cause of wound was Pressure Injury. The wound is located on the Left Ischium. The wound measures 12.7cm length x 16cm width x 0.1cm depth; 159.593cm^2 area and 15.959cm^3 volume. There is Fat Layer (Subcutaneous Tissue) Exposed exposed. There is no tunneling or undermining noted. There is a large amount of serous drainage noted. Foul odor after cleansing was noted. The wound margin is flat and intact. There is small (1-33%) pink granulation within the wound bed. There is a large (67-100%) amount of necrotic tissue within the wound bed including Eschar and Adherent  Slough. The periwound skin appearance exhibited: Scarring, Erythema. The periwound skin appearance did not exhibit: Callus, Crepitus, Excoriation, Induration, Rash, Dry/Scaly, Maceration, Atrophie Blanche, Cyanosis, Ecchymosis, Hemosiderin Staining, Mottled, Pallor, Rubor. The surrounding wound skin color is noted with erythema. 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 Dakin's solution to be packed into the area for sacral region, and this is to be changed daily 2. X-ray of the sacrum and pelvis to look for osteomyelitis 3. Silver alginate on all the other areas where he has open wounds 4. Constant offloading has been discussed with him in great detail 5. Appropriate low air loss mattress and Roho cushion 6. adequate protein, vitamin A, vitamin C and zinc He has a full understanding of his treatment plan and  is agreeable to follow this until he goes back to his normal residence. He does understand at some stage if there is significant osteomyelitis of his bone in the sacral region he may need surgical debridement and or prolonged IV antibiotics Procedures Wound #2 Pre-procedure diagnosis of Wound #2 is a Pressure Ulcer located on the Right,Plantar Foot .Severity of Tissue Pre Debridement is: Fat layer exposed. There was a Skin/Subcutaneous Tissue  Debridement ([id number]) debridement with total area of 8 sq cm performed by Evlyn Kanner, MD. with the following instrument(s): Forceps and Scissors to remove Viable and Non-Viable tissue/material including Fibrin/Slough, Eschar, and Subcutaneous after achieving pain control using Lidocaine 4% Topical Solution. A time out was conducted at 14:29, prior to the start of the procedure. A Minimum amount of bleeding was controlled with Pressure. The procedure was tolerated well with a pain level of 0 throughout and a pain level of 0 following the procedure. Post Debridement Measurements: 4.5cm length x 6.2cm width x 0.3cm depth; 6.574cm^3 volume. Post debridement Stage noted as Unstageable/Unclassified. Character of Wound/Ulcer Post Debridement is improved. Severity of Tissue Post Debridement is: Fat layer exposed. Post procedure Diagnosis Wound #2: Same as Pre-Procedure Wound #6 Pre-procedure diagnosis of Wound #6 is a Pressure Ulcer located on the Right Sacrum . There was a Skin/Subcutaneous Tissue Debridement ([id number]) debridement with total area of 6 sq cm performed by Evlyn Kanner, MD. with the following instrument(s): Forceps and Scissors to remove Viable and Non-Viable tissue/material including Fibrin/Slough, Eschar, and Subcutaneous after achieving pain control using Lidocaine 4% Topical Solution. A time out was conducted at 14:34, prior to [place] ([id number]) the start of the procedure. A Minimum amount of bleeding was controlled with Pressure. The procedure was tolerated well with a pain level of 0 throughout and a pain level of 0 following the procedure. Post Debridement Measurements: 16.5cm length x 15.5cm width x 5cm depth; 1004.328cm^3 volume. Post debridement Stage noted as Category/Stage IV. Character of Wound/Ulcer Post Debridement is improved. Post procedure Diagnosis Wound #6: Same as Pre-Procedure Plan Wound Cleansing: Wound #1 Midline Abdomen - Lower  Quadrant: Cleanse wound with mild soap and water May Shower, gently pat wound dry prior to applying new dressing. Wound #2 Right,Plantar Foot: Cleanse wound with mild soap and water May Shower, gently pat wound dry prior to applying new dressing. Wound #3 Left,Lateral Lower Leg: Cleanse wound with mild soap and water May Shower, gently pat wound dry prior to applying new dressing. Wound #4 Left,Posterior Upper Leg: Cleanse wound with mild soap and water May Shower, gently pat wound dry prior to applying new dressing. Wound #5 Right,Posterior Upper Leg: Cleanse wound with mild soap and water May Shower, gently pat wound dry prior to applying new dressing. Wound #6 Right Sacrum: Cleanse wound with mild soap and water May Shower, gently pat wound dry prior to applying new dressing. Wound #7 Left Ischium: Cleanse wound with mild soap and water May Shower, gently pat wound dry prior to applying new dressing. Anesthetic: Wound #1 Midline Abdomen - Lower Quadrant: Topical Lidocaine 4% cream applied to wound bed prior to debridement Wound #2 Right,Plantar Foot: Topical Lidocaine 4% cream applied to wound bed prior to debridement Wound #3 Left,Lateral Lower Leg: Topical Lidocaine 4% cream applied to wound bed prior to debridement Wound #4 Left,Posterior Upper Leg: Topical Lidocaine 4% cream applied to wound bed prior to debridement Wound #5 Right,Posterior Upper Leg: Topical Lidocaine 4% cream applied to wound bed prior to debridement Wound #6 Right Sacrum: Topical Lidocaine  4% cream applied to wound bed prior to debridement Wound #7 Left Ischium: Topical Lidocaine 4% cream applied to wound bed prior to debridement Primary Wound Dressing: Wound #1 Midline Abdomen - Lower Quadrant: Aquacel Ag - or calcium alginate equivalent Wound #2 Right,Plantar Foot: Aquacel Ag - or calcium alginate equivalent Wound #3 Left,Lateral Lower Leg: Aquacel Ag - or calcium alginate equivalent Wound #4  Left,Posterior Upper Leg: Aquacel Ag - or calcium alginate equivalent THERESA, DOHRMAN ([id number]) Wound #5 Right,Posterior Upper Leg: Aquacel Ag - or calcium alginate equivalent Wound #7 Left Ischium: Aquacel Ag - or calcium alginate equivalent Wound #6 Right Sacrum: Other: - Dakins soaked gauze Secondary Dressing: Boardered Foam Dressing - on deep tissue injury on right foot Wound #1 Midline Abdomen - Lower Quadrant: ABD pad Dry Gauze XtraSorb Wound #2 Right,Plantar Foot: ABD pad Dry Gauze XtraSorb Wound #3 Left,Lateral Lower Leg: ABD pad Dry Gauze XtraSorb Wound #4 Left,Posterior Upper Leg: ABD pad Dry Gauze XtraSorb Wound #5 Right,Posterior Upper Leg: ABD pad Dry Gauze XtraSorb Wound #6 Right Sacrum: ABD pad Dry Gauze XtraSorb Wound #7 Left Ischium: ABD pad Dry Gauze XtraSorb Dressing Change Frequency: Wound #1 Midline Abdomen - Lower Quadrant: Change dressing every day. - and as needed for excess drainage Wound #2 Right,Plantar Foot: Change dressing every day. - and as needed for excess drainage Wound #3 Left,Lateral Lower Leg: Change dressing every day. - and as needed for excess drainage Wound #4 Left,Posterior Upper Leg: Change dressing every day. - and as needed for excess drainage Wound #5 Right,Posterior Upper Leg: Change dressing every day. - and as needed for excess drainage Wound #6 Right Sacrum: Change dressing every day. - and as needed for excess drainage Wound #7 Left Ischium: Change dressing every day. - and as needed for excess drainage Follow-up Appointments: Wound #1 Midline Abdomen - Lower Quadrant: Other: - patient to follow up with Allen Derry PA at facility Wound #2 Right,Plantar Foot: Other: - patient to follow up with Allen Derry PA at facility Wound #3 Left,Lateral Lower Leg: Other: - patient to follow up with Allen Derry PA at facility Wound #4 Left,Posterior Upper Leg: Other: - patient to follow up with Allen Derry PA at  facility [place], Cheree Ditto ([id number]) Wound #5 Right,Posterior Upper Leg: Other: - patient to follow up with Allen Derry PA at facility Wound #6 Right Sacrum: Other: - patient to follow up with Allen Derry PA at facility Wound #7 Left Ischium: Other: - patient to follow up with Allen Derry PA at facility Off-Loading: Wound #1 Midline Abdomen - Lower Quadrant: Mattress - patient needs air mattress please - facility to provide this for patient Turn and reposition every 2 hours Other: - Patient to wear sage/bunny boots to protect feet - facility to provide this for patient Wound #2 Right,Plantar Foot: Mattress - patient needs air mattress please - facility to provide this for patient Turn and reposition every 2 hours Other: - Patient to wear sage/bunny boots to protect feet - facility to provide this for patient Wound #3 Left,Lateral Lower Leg: Mattress - patient needs air mattress please - facility to provide this for patient Turn and reposition every 2 hours Other: - Patient to wear sage/bunny boots to protect feet - facility to provide this for patient Wound #4 Left,Posterior Upper Leg: Mattress - patient needs air mattress please - facility to provide this for patient Turn and reposition every 2 hours Other: - Patient to wear sage/bunny boots to protect feet - facility to provide this  for patient Wound #5 Right,Posterior Upper Leg: Mattress - patient needs air mattress please - facility to provide this for patient Turn and reposition every 2 hours Other: - Patient to wear sage/bunny boots to protect feet - facility to provide this for patient Wound #6 Right Sacrum: Mattress - patient needs air mattress please - facility to provide this for patient Turn and reposition every 2 hours Other: - Patient to wear sage/bunny boots to protect feet - facility to provide this for patient Wound #7 Left Ischium: Mattress - patient needs air mattress please - facility to provide this for  patient Turn and reposition every 2 hours Other: - Patient to wear sage/bunny boots to protect feet - facility to provide this for patient Additional Orders / Instructions: Wound #1 Midline Abdomen - Lower Quadrant: Increase protein intake. Wound #2 Right,Plantar Foot: Increase protein intake. Wound #3 Left,Lateral Lower Leg: Increase protein intake. Wound #4 Left,Posterior Upper Leg: Increase protein intake. Wound #5 Right,Posterior Upper Leg: Increase protein intake. Wound #6 Right Sacrum: Increase protein intake. Wound #7 Left Ischium: Increase protein intake. Radiology ordered were: X-ray, other - pelvis/sacrum, Facility to obtain mobile xray and fax results to [phone] asap BRAYDEN, BRODHEAD ([id number]) this unfortunate 36 year old diabetic has been relocated to a skilled nursing facility in this area because of a headache and in his normal residence in [place] He is going to be seen by the wound care physician assistant next week and is here to initiate treatment today After review I have recommended: 1. Dakin's solution to be packed into the area for sacral region, and this is to be changed daily 2. X-ray of the sacrum and pelvis to look for osteomyelitis 3. Silver alginate on all the other areas where he has open wounds 4. Constant offloading has been discussed with him in great detail 5. Appropriate low air loss mattress and Roho cushion 6. adequate protein, vitamin A, vitamin C and zinc He has a full understanding of his treatment plan and is agreeable to follow this until he goes back to his normal residence. He does understand at some stage if there is significant osteomyelitis of his bone in the sacral region he may need surgical debridement and or prolonged IV antibiotics Electronic Signature(s) Signed: 05/22/2017 3:30:56 PM By: Evlyn Kanner MD, FACS Entered By: Evlyn Kanner on 05/22/2017 15:30:56 [name]  ([id number]) -------------------------------------------------------------------------------- ROS/PFSH Details Patient Name: [name] Date of Service: 05/22/2017 1:15 PM Medical Record Number: [id number] Patient Account Number: [id number] Date of Birth/Sex: Sep 28, 1980 (36 y.o. Male) Treating RN: Curtis Sites Primary Care Provider: PATIENT, NO Other Clinician: Referring Provider: Elodia Florence Treating Provider/Extender: Rudene Re in Treatment: 0 Information Obtained From Patient Wound History Do you currently have one or more open woundso Yes How many open wounds do you currently haveo 7 Approximately how long have you had your woundso 2 years How have you been treating your wound(s) until nowo aquacel ag Has your wound(s) ever healed and then re-openedo No Have you had any lab work done in the past montho No Have you had any tests for circulation on your legso No Integumentary (Skin) Complaints and Symptoms: Positive for: Wounds Psychiatric Complaints and Symptoms: Positive for: Anxiety Constitutional Symptoms (General Health) Complaints and Symptoms: No Complaints or Symptoms Hematologic/Lymphatic Medical History: Positive for: Anemia Respiratory Complaints and Symptoms: No Complaints or Symptoms Cardiovascular Complaints and Symptoms: No Complaints or Symptoms Gastrointestinal Complaints and Symptoms: Review of System Notes: colostomy Endocrine RAUNAK, ANTUNA ([id number]) Medical History: Positive for:  Type II Diabetes Time with diabetes: 2013 Treated with: Insulin Blood sugar tested every day: Yes Tested : Genitourinary Complaints and Symptoms: Review of System Notes: urolostomy Immunological Complaints and Symptoms: No Complaints or Symptoms Musculoskeletal Complaints and Symptoms: No Complaints or Symptoms Neurologic Medical History: Positive for: Paraplegia Oncologic Complaints and Symptoms: No Complaints or  Symptoms Immunizations Pneumococcal Vaccine: Received Pneumococcal Vaccination: Yes Implantable Devices Family and Social History Cancer: No; Diabetes: No; Heart Disease: No; Hereditary Spherocytosis: No; Hypertension: Yes - Father; Kidney Disease: No; Lung Disease: Yes - Mother; Seizures: No; Stroke: No; Thyroid Problems: No; Tuberculosis: No; Former smoker - quit 2016; Marital Status - Single; Alcohol Use: Rarely - social; Drug Use: Prior History - cannabis; Caffeine Use: Daily; Financial Concerns: No; Food, Clothing or Shelter Needs: No; Support System Lacking: No; Transportation Concerns: No; Advanced Directives: No; Patient does not want information on Advanced Directives; Do not resuscitate: No; Living Will: No; Medical Power of Attorney: Yes - lois hutchins (Not Provided) Physician Affirmation I have reviewed and agree with the above information. Electronic Signature(s) Signed: 05/22/2017 4:32:33 PM By: Evlyn Kanner MD, FACS Signed: 05/22/2017 5:11:26 PM By: Curtis Sites Entered By: Curtis Sites on 05/22/2017 14:25:46 [name] ([id number]) -------------------------------------------------------------------------------- SuperBill Details Patient Name: [name] Date of Service: 05/22/2017 Medical Record Number: [id number] Patient Account Number: [id number] Date of Birth/Sex: 12-23-1980 (36 y.o. 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, buttock and hip, stage 4 L89.610 Pressure ulcer of right heel, unstageable L89.620 Pressure ulcer of left heel, unstageable E44.1 Mild protein-calorie malnutrition Facility Procedures CPT4 Code:  [id number] Description: 99213 - WOUND CARE VISIT-LEV 3 EST PT Modifier: Quantity: 1 CPT4 Code: [id number] Description: 11042 - DEB SUBQ TISSUE 20 SQ CM/< ICD-10 Diagnosis Description E11.621 Type 2 diabetes mellitus with foot ulcer G82.21 Paraplegia, complete L89.44 Pressure ulcer of contiguous site of back, buttock and hip, L89.610 Pressure ulcer of right  heel, unstageable Modifier: stage 4 Quantity: 1 Physician Procedures CPT4 Code: [id number] Description: 99204 - WC PHYS LEVEL 4 - NEW PT ICD-10 Diagnosis Description E11.621 Type 2 diabetes mellitus with foot ulcer G82.21 Paraplegia, complete L89.44 Pressure ulcer of contiguous site of back, buttock and hip, L89.610 Pressure ulcer of right  heel, unstageable Modifier: 25 stage 4 Quantity: 1 CPT4 Code: [id number] Description: 11042 - WC PHYS SUBQ TISS 20 SQ CM ICD-10 Diagnosis Description E11.621 Type 2 diabetes mellitus with foot ulcer G82.21 Paraplegia, complete L89.44 Pressure ulcer of contiguous site of back, buttock and hip, L89.610 Pressure ulcer of right  heel, unstageable Modifier: stage 4 Quantity: 1 Electronic Signature(s) Signed: 05/22/2017 4:45:21 PM By: Kela Millin, Cheree Ditto ([id number]) Signed: 05/23/2017 3:39:10 PM By: Evlyn Kanner MD, FACS Previous Signature: 05/22/2017 3:31:15 PM Version By: Evlyn Kanner MD, FACS Entered By: Curtis Sites on 05/22/2017 16:45:20

## 2017-07-28 ENCOUNTER — Telehealth: Payer: Self-pay | Admitting: *Deleted

## 2017-07-28 NOTE — Telephone Encounter (Signed)
Entered in error

## 2018-06-14 DEATH — deceased
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
